# Patient Record
Sex: Female | Born: 1970
Health system: Southern US, Community
[De-identification: ages and names within clinical notes are randomized; demographics above are authoritative.]

## PROBLEM LIST (undated history)

## (undated) DIAGNOSIS — T7840XA Allergy, unspecified, initial encounter: Secondary | ICD-10-CM

## (undated) DIAGNOSIS — E785 Hyperlipidemia, unspecified: Secondary | ICD-10-CM

## (undated) DIAGNOSIS — E669 Obesity, unspecified: Secondary | ICD-10-CM

## (undated) HISTORY — DX: Hyperlipidemia, unspecified: E78.5

## (undated) HISTORY — DX: Allergy, unspecified, initial encounter: T78.40XA

## (undated) HISTORY — DX: Obesity, unspecified: E66.9

---

## 2003-08-23 ENCOUNTER — Emergency Department (HOSPITAL_COMMUNITY): Admission: EM | Admit: 2003-08-23 | Discharge: 2003-08-24 | Payer: Self-pay | Admitting: *Deleted

## 2003-08-25 ENCOUNTER — Emergency Department (HOSPITAL_COMMUNITY): Admission: EM | Admit: 2003-08-25 | Discharge: 2003-08-25 | Payer: Self-pay | Admitting: Emergency Medicine

## 2004-04-08 ENCOUNTER — Observation Stay: Payer: Self-pay

## 2004-06-07 ENCOUNTER — Observation Stay: Payer: Self-pay | Admitting: Obstetrics & Gynecology

## 2004-06-08 ENCOUNTER — Inpatient Hospital Stay: Payer: Self-pay | Admitting: Obstetrics & Gynecology

## 2004-08-13 ENCOUNTER — Emergency Department: Payer: Self-pay | Admitting: Emergency Medicine

## 2004-08-14 ENCOUNTER — Ambulatory Visit: Payer: Self-pay | Admitting: Emergency Medicine

## 2005-05-03 ENCOUNTER — Ambulatory Visit: Payer: Self-pay | Admitting: Obstetrics and Gynecology

## 2005-07-29 IMAGING — CR DG CHEST 1V PORT
1 series · 1 of 1 positions shown · non-contrast
Comparison: none

REASON FOR EXAM: sob/difficulty of breathing
COMMENTS:

PROCEDURE:     DXR - DXR PORTABLE CHEST SINGLE VIEW  - June 08, 2004  [DATE]
RESULT:     Portable chest shows the lungs to be clear and well expanded
without evidence of infiltrates, effusions, or mass.

[view not recorded]
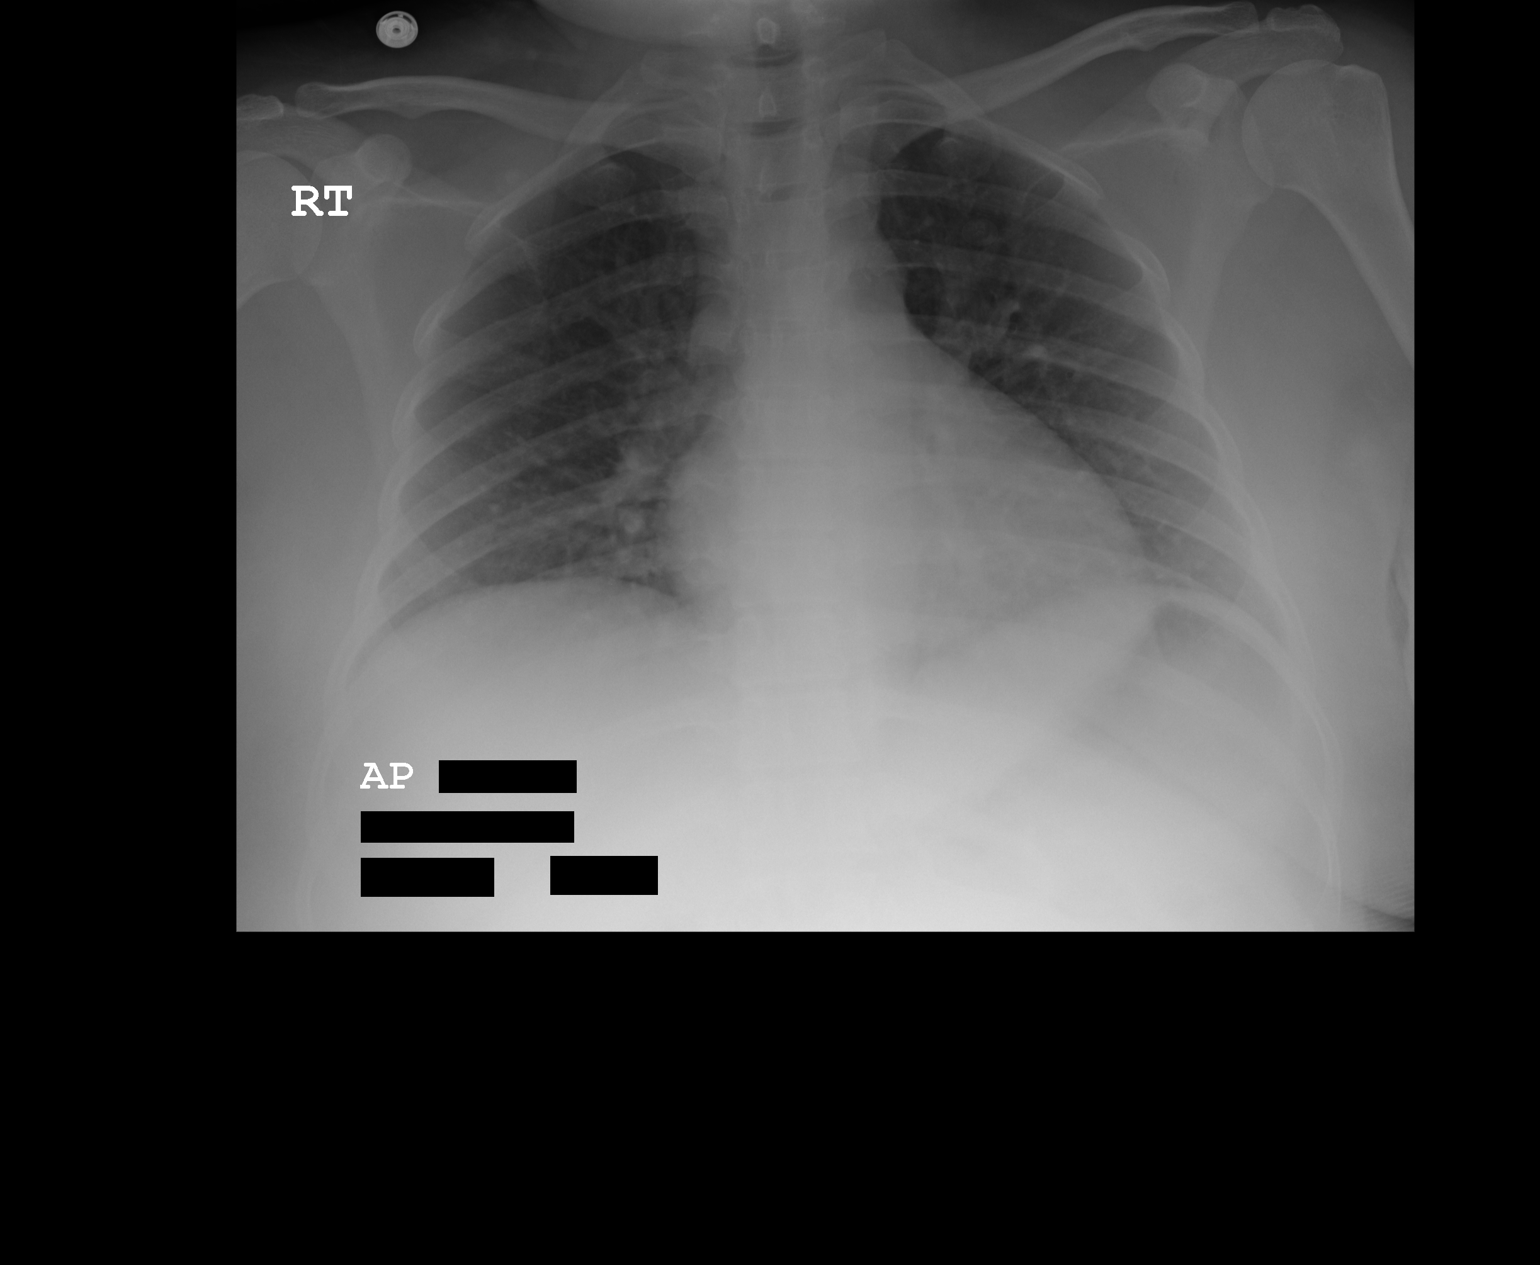

[1 of 1 positions shown; findings below may reference images not displayed]

IMPRESSION: 1)No acute pulmonary disease.

## 2009-06-17 ENCOUNTER — Emergency Department (HOSPITAL_COMMUNITY): Admission: EM | Admit: 2009-06-17 | Discharge: 2009-06-17 | Payer: Self-pay | Admitting: Emergency Medicine

## 2009-12-19 ENCOUNTER — Emergency Department (HOSPITAL_COMMUNITY): Admission: EM | Admit: 2009-12-19 | Discharge: 2009-12-20 | Payer: Self-pay | Admitting: Emergency Medicine

## 2009-12-27 ENCOUNTER — Emergency Department (HOSPITAL_COMMUNITY)
Admission: EM | Admit: 2009-12-27 | Discharge: 2009-12-27 | Payer: Self-pay | Source: Home / Self Care | Admitting: Emergency Medicine

## 2010-02-02 ENCOUNTER — Ambulatory Visit (HOSPITAL_COMMUNITY): Admission: RE | Admit: 2010-02-02 | Payer: Self-pay | Source: Home / Self Care | Admitting: Family Medicine

## 2010-03-11 ENCOUNTER — Other Ambulatory Visit (HOSPITAL_COMMUNITY): Payer: Self-pay | Admitting: *Deleted

## 2010-03-11 DIAGNOSIS — Z139 Encounter for screening, unspecified: Secondary | ICD-10-CM

## 2010-03-17 ENCOUNTER — Ambulatory Visit (HOSPITAL_COMMUNITY): Payer: Self-pay

## 2010-03-17 ENCOUNTER — Ambulatory Visit (HOSPITAL_COMMUNITY)
Admission: RE | Admit: 2010-03-17 | Discharge: 2010-03-17 | Disposition: A | Discharge status: Home / Self Care | Payer: PRIVATE HEALTH INSURANCE | Source: Ambulatory Visit | Attending: Family Medicine | Admitting: Family Medicine

## 2010-03-17 DIAGNOSIS — Z1231 Encounter for screening mammogram for malignant neoplasm of breast: Secondary | ICD-10-CM | POA: Insufficient documentation

## 2010-03-17 DIAGNOSIS — Z139 Encounter for screening, unspecified: Secondary | ICD-10-CM

## 2010-04-06 LAB — CBC
Hemoglobin: 14.7 g/dL (ref 12.0–15.0)
MCH: 29.2 pg (ref 26.0–34.0)
MCV: 81.9 fL (ref 78.0–100.0)
Platelets: 327 10*3/uL (ref 150–400)
RBC: 5.03 MIL/uL (ref 3.87–5.11)
WBC: 9.5 10*3/uL (ref 4.0–10.5)

## 2010-04-06 LAB — URINALYSIS, ROUTINE W REFLEX MICROSCOPIC
Bilirubin Urine: NEGATIVE
Glucose, UA: NEGATIVE mg/dL
Ketones, ur: NEGATIVE mg/dL
Nitrite: NEGATIVE
Specific Gravity, Urine: <1.005 — ABNORMAL LOW (ref 1.005–1.030)
pH: 5 (ref 5.0–8.0)

## 2010-04-06 LAB — LIPASE, BLOOD: Lipase: 28 U/L (ref 11–59)

## 2010-04-06 LAB — COMPREHENSIVE METABOLIC PANEL
ALT: 29 U/L (ref 0–35)
AST: 24 U/L (ref 0–37)
Albumin: 4.5 g/dL (ref 3.5–5.2)
Alkaline Phosphatase: 77 U/L (ref 39–117)
CO2: 28 mEq/L (ref 19–32)
Chloride: 101 mEq/L (ref 96–112)
Creatinine, Ser: 0.74 mg/dL (ref 0.4–1.2)
GFR calc Af Amer: >60 mL/min (ref >60)
GFR calc non Af Amer: >60 mL/min (ref >60)
Potassium: 3.9 mEq/L (ref 3.5–5.1)
Total Bilirubin: 0.3 mg/dL (ref 0.3–1.2)

## 2010-04-06 LAB — DIFFERENTIAL
Basophils Absolute: 0 10*3/uL (ref 0.0–0.1)
Basophils Relative: 0 % (ref 0–1)
Eosinophils Absolute: 0.1 10*3/uL (ref 0.0–0.7)
Eosinophils Relative: 1 % (ref 0–5)
Monocytes Absolute: 0.5 10*3/uL (ref 0.1–1.0)

## 2010-04-07 LAB — URINALYSIS, ROUTINE W REFLEX MICROSCOPIC
Bilirubin Urine: NEGATIVE
Ketones, ur: NEGATIVE mg/dL
Nitrite: NEGATIVE
Protein, ur: NEGATIVE mg/dL
Specific Gravity, Urine: 1.02 (ref 1.005–1.030)
Urobilinogen, UA: 0.2 mg/dL (ref 0.0–1.0)

## 2010-04-07 LAB — URINE MICROSCOPIC-ADD ON

## 2010-11-13 ENCOUNTER — Emergency Department (HOSPITAL_COMMUNITY)
Admission: EM | Admit: 2010-11-13 | Discharge: 2010-11-14 | Disposition: A | Discharge status: Home / Self Care | Payer: Self-pay | Attending: Emergency Medicine | Admitting: Emergency Medicine

## 2010-11-13 ENCOUNTER — Emergency Department (HOSPITAL_COMMUNITY): Payer: Self-pay

## 2010-11-13 DIAGNOSIS — N898 Other specified noninflammatory disorders of vagina: Secondary | ICD-10-CM | POA: Insufficient documentation

## 2010-11-13 DIAGNOSIS — N949 Unspecified condition associated with female genital organs and menstrual cycle: Secondary | ICD-10-CM | POA: Insufficient documentation

## 2010-11-13 LAB — URINALYSIS, ROUTINE W REFLEX MICROSCOPIC
Ketones, ur: NEGATIVE mg/dL
Nitrite: NEGATIVE
pH: 8 (ref 5.0–8.0)

## 2010-11-13 LAB — POCT PREGNANCY, URINE: Preg Test, Ur: NEGATIVE

## 2010-11-13 LAB — URINE MICROSCOPIC-ADD ON

## 2010-11-13 LAB — WET PREP, GENITAL
Trich, Wet Prep: NONE SEEN
Yeast Wet Prep HPF POC: NONE SEEN

## 2010-11-15 LAB — URINE CULTURE
Colony Count: >=100000
Culture  Setup Time: 201210200321

## 2011-01-26 HISTORY — PX: ABDOMINAL HYSTERECTOMY: SHX81

## 2012-12-26 ENCOUNTER — Ambulatory Visit: Payer: PRIVATE HEALTH INSURANCE | Attending: Internal Medicine

## 2013-01-10 ENCOUNTER — Encounter (HOSPITAL_COMMUNITY): Payer: Self-pay | Admitting: Emergency Medicine

## 2013-01-10 ENCOUNTER — Emergency Department (HOSPITAL_COMMUNITY)
Admission: EM | Admit: 2013-01-10 | Discharge: 2013-01-10 | Disposition: A | Discharge status: Home / Self Care | Payer: No Typology Code available for payment source | Source: Home / Self Care

## 2013-01-10 DIAGNOSIS — J329 Chronic sinusitis, unspecified: Secondary | ICD-10-CM

## 2013-01-10 DIAGNOSIS — J069 Acute upper respiratory infection, unspecified: Secondary | ICD-10-CM

## 2013-01-10 DIAGNOSIS — R05 Cough: Secondary | ICD-10-CM

## 2013-01-10 DIAGNOSIS — R059 Cough, unspecified: Secondary | ICD-10-CM

## 2013-01-10 MED ORDER — PHENYLEPHRINE-CHLORPHEN-DM 10-4-12.5 MG/5ML PO LIQD: 5.0000 mL | ORAL | Status: DC | PRN

## 2013-01-10 MED ORDER — GUAIFENESIN-CODEINE 100-10 MG/5ML PO SYRP: 5.0000 mL | ORAL_SOLUTION | Freq: Four times a day (QID) | ORAL | Status: DC | PRN

## 2013-01-10 NOTE — ED Notes (Signed)
Cough, congestion x 1 week or so; NAD; sore  in upper back, throat, chest

## 2013-01-10 NOTE — ED Provider Notes (Signed)
CSN: [id number]     Arrival date & time 01/10/13  1236 History   First MD Initiated Contact with Patient 01/10/13 1457     Chief Complaint  Patient presents with  . Cough   (Consider location/radiation/quality/duration/timing/severity/associated sxs/prior Treatment) HPI Comments: 42 year old female complaining of cough, sore throat, nasal congestion, PND that is worse when supine for 5 days. She states the PND and cough is worse when supine. Denies earache or fever. She is taking Alka-Seltzer cold plus but without significant relief.   History reviewed. No pertinent past medical history. History reviewed. No pertinent past surgical history. History reviewed. No pertinent family history. History  Substance Use Topics  . Smoking status: Never Smoker   . Smokeless tobacco: Not on file  . Alcohol Use: No   OB History   Grav Para Term Preterm Abortions TAB SAB Ect Mult Living                 Review of Systems  Constitutional: Negative for fever, chills, activity change, appetite change and fatigue.  HENT: Positive for congestion, postnasal drip, rhinorrhea and sore throat. Negative for facial swelling.   Eyes: Negative.   Respiratory: Positive for cough. Negative for shortness of breath and wheezing.   Cardiovascular: Negative.   Gastrointestinal: Negative.   Genitourinary: Negative.   Musculoskeletal: Negative for neck pain and neck stiffness.  Skin: Negative for pallor and rash.  Neurological: Negative.     Allergies  Review of patient's allergies indicates no known allergies.  Home Medications   Current Outpatient Rx  Name  Route  Sig  Dispense  Refill  . guaiFENesin-codeine (CHERATUSSIN AC) 100-10 MG/5ML syrup   Oral   Take 5 mLs by mouth 4 (four) times daily as needed for cough or congestion.   120 mL   0   . Phenylephrine-Chlorphen-DM 10-29-10.5 MG/5ML LIQD   Oral   Take 5 mLs by mouth every 4 (four) hours as needed.   120 mL   0    BP 103/63  Pulse 88   Temp(Src) 98.3 F (36.8 C) (Oral)  Resp 16  SpO2 100% Physical Exam  Nursing note and vitals reviewed. Constitutional: She is oriented to person, place, and time. She appears well-developed and well-nourished. No distress.  HENT:  Mouth/Throat: No oropharyngeal exudate.  Bilateral TMs are normal Oropharynx with minor erythema and clear PND.  Eyes: EOM are normal.  Neck: Normal range of motion. Neck supple.  Cardiovascular: Normal rate, regular rhythm and normal heart sounds.   Pulmonary/Chest: Effort normal and breath sounds normal. No respiratory distress. She has no wheezes. She has no rales.  Musculoskeletal: Normal range of motion. She exhibits no edema.  Lymphadenopathy:    She has no cervical adenopathy.  Neurological: She is alert and oriented to person, place, and time.  Skin: Skin is warm and dry. No rash noted.  Psychiatric: She has a normal mood and affect.    ED Course  Procedures (including critical care time) Labs Review Labs Reviewed - No data to display Imaging Review No results found.    MDM   1. URI (upper respiratory infection)   2. Rhinosinusitis   3. Cough      Norell CS 1 teaspoon every 4 hours when necessary URI symptoms Cheratussin 1 teaspoon q. 4 hours when necessary cough and congestion Drink plenty of fluids stay well hydrated URI instructions  Followup with your PCP as needed.  Hayden Rasmussen, NP 01/10/13 [phone]

## 2013-01-11 NOTE — ED Provider Notes (Signed)
Medical screening examination/treatment/procedure(s) were performed by resident physician or non-physician practitioner and as supervising physician I was immediately available for consultation/collaboration.   Barkley Bruns MD.   Linna Hoff, MD 01/11/13 [phone]

## 2013-01-17 ENCOUNTER — Ambulatory Visit: Payer: No Typology Code available for payment source | Attending: Internal Medicine | Admitting: Internal Medicine

## 2013-01-17 ENCOUNTER — Encounter: Payer: Self-pay | Admitting: Internal Medicine

## 2013-01-17 VITALS — BP 104/71 | HR 109 | Temp 98.7°F | Resp 14 | Ht <= 58 in | Wt 157.2 lb

## 2013-01-17 DIAGNOSIS — R05 Cough: Secondary | ICD-10-CM

## 2013-01-17 DIAGNOSIS — R0981 Nasal congestion: Secondary | ICD-10-CM | POA: Insufficient documentation

## 2013-01-17 DIAGNOSIS — Z139 Encounter for screening, unspecified: Secondary | ICD-10-CM

## 2013-01-17 DIAGNOSIS — J3489 Other specified disorders of nose and nasal sinuses: Secondary | ICD-10-CM

## 2013-01-17 DIAGNOSIS — J069 Acute upper respiratory infection, unspecified: Secondary | ICD-10-CM | POA: Insufficient documentation

## 2013-01-17 DIAGNOSIS — Z9109 Other allergy status, other than to drugs and biological substances: Secondary | ICD-10-CM | POA: Insufficient documentation

## 2013-01-17 LAB — CBC WITH DIFFERENTIAL/PLATELET
Basophils Absolute: 0 10*3/uL (ref 0.0–0.1)
Lymphocytes Relative: 29 % (ref 12–46)
Neutro Abs: 4.4 10*3/uL (ref 1.7–7.7)
Platelets: 322 10*3/uL (ref 150–400)
RDW: 14.4 % (ref 11.5–15.5)
WBC: 7.1 10*3/uL (ref 4.0–10.5)

## 2013-01-17 LAB — LIPID PANEL
Cholesterol: 178 mg/dL (ref 0–200)
HDL: 45 mg/dL (ref >39)
Total CHOL/HDL Ratio: 4 Ratio
VLDL: 33 mg/dL (ref 0–40)

## 2013-01-17 LAB — COMPLETE METABOLIC PANEL WITH GFR
ALT: 27 U/L (ref 0–35)
AST: 21 U/L (ref 0–37)
Albumin: 4.1 g/dL (ref 3.5–5.2)
Alkaline Phosphatase: 95 U/L (ref 39–117)
Calcium: 9.4 mg/dL (ref 8.4–10.5)
Chloride: 104 mEq/L (ref 96–112)
Creat: 0.79 mg/dL (ref 0.50–1.10)
Potassium: 4.2 mEq/L (ref 3.5–5.3)

## 2013-01-17 MED ORDER — FLUTICASONE PROPIONATE 50 MCG/ACT NA SUSP: 2.0000 | Freq: Every day | NASAL | Status: DC

## 2013-01-17 MED ORDER — CETIRIZINE HCL 10 MG PO CAPS: 1.0000 | ORAL_CAPSULE | Freq: Every evening | ORAL | Status: DC | PRN

## 2013-01-17 MED ORDER — GUAIFENESIN-CODEINE 100-10 MG/5ML PO SYRP: 5.0000 mL | ORAL_SOLUTION | Freq: Four times a day (QID) | ORAL | Status: DC | PRN

## 2013-01-17 MED ORDER — AZITHROMYCIN 250 MG PO TABS: ORAL_TABLET | ORAL | Status: DC

## 2013-01-17 NOTE — Progress Notes (Signed)
Patient Demographics  Shelby Mccann, is a 42 y.o. female  RUE:[id number]  JYN:[id number]  DOB - 07-31-1970  CC:  Chief Complaint  Patient presents with  . Establish Care       HPI: Shelby Mccann is a 42 y.o. female here today to establish medical care. Patient reported to have ongoing URI symptoms of nasal congestion postnasal drip minimal productive cough headache for the last 4 weeks, she went to the urgent care recently and was prescribed cough medication she reports minimal improvement currently denies any fever chills denies any shortness of breath Patient has No headache, No chest pain, No abdominal pain - No Nausea, No new weakness tingling or numbness, patient also has some and her mental allergies she is requesting an allergy medication.  No Known Allergies History reviewed. No pertinent past medical history. Current Outpatient Prescriptions on File Prior to Visit  Medication Sig Dispense Refill  . Phenylephrine-Chlorphen-DM 10-29-10.5 MG/5ML LIQD Take 5 mLs by mouth every 4 (four) hours as needed.  120 mL  0   No current facility-administered medications on file prior to visit.   Family History  Problem Relation Age of Onset  . Cancer Mother     utetrine cancer   . Cancer Brother    History   Social History  . Marital Status: Single    Spouse Name: N/A    Number of Children: N/A  . Years of Education: N/A   Occupational History  . Not on file.   Social History Main Topics  . Smoking status: Never Smoker   . Smokeless tobacco: Not on file  . Alcohol Use: No  . Drug Use: Not on file  . Sexual Activity: Not on file   Other Topics Concern  . Not on file   Social History Narrative  . No narrative on file    Review of Systems: Constitutional: Negative for fever, chills, diaphoresis, activity change, appetite change and fatigue. HENT: Negative for ear pain, nosebleeds, +congestion, facial swelling, +rhinorrhea, neck pain, neck stiffness and ear discharge.  Eyes:  Negative for pain, discharge, redness, itching and visual disturbance. Respiratory: Positive for cough, negative for choking, chest tightness, shortness of breath, wheezing and stridor.  Cardiovascular: Negative for chest pain, palpitations and leg swelling. Gastrointestinal: Negative for abdominal distention. Genitourinary: Negative for dysuria, urgency, frequency, hematuria, flank pain, decreased urine volume, difficulty urinating and dyspareunia.  Musculoskeletal: Negative for back pain, joint swelling, arthralgia and gait problem. Neurological: Negative for dizziness, tremors, seizures, syncope, facial asymmetry, speech difficulty, weakness, light-headedness, numbness and headaches.  Hematological: Negative for adenopathy. Does not bruise/bleed easily. Psychiatric/Behavioral: Negative for hallucinations, behavioral problems, confusion, dysphoric mood, decreased concentration and agitation.    Objective:   Filed Vitals:   01/17/13 1301  BP: 104/71  Pulse: 109  Temp: 98.7 F (37.1 C)  Resp: 14    Physical Exam: Constitutional: Patient appears well-developed and well-nourished. No distress. HENT: Normocephalic, atraumatic, External right and left ear normal. Oropharynx is clear and moist. Minimal pharyngeal erythema. Nasal congestion no sinus tenderness  Eyes: Conjunctivae and EOM are normal. PERRLA, no scleral icterus. Neck: Normal ROM. Neck supple. No JVD. No tracheal deviation. No thyromegaly. CVS: RRR, S1/S2 +, no murmurs, no gallops, no carotid bruit.  Pulmonary: Effort and breath sounds normal, no stridor, rhonchi, wheezes, rales.  Abdominal: Soft. BS +, no distension, tenderness, rebound or guarding.  Musculoskeletal: Normal range of motion. No edema and no tenderness.  Skin: Skin is warm and dry. No rash noted. Not diaphoretic. No  erythema. No pallor. Psychiatric: Normal mood and affect. Behavior, judgment, thought content normal.  Lab Results  Component Value Date   WBC  9.5 12/27/2009   HGB 14.7 12/27/2009   HCT 41.2 12/27/2009   MCV 81.9 12/27/2009   PLT 327 12/27/2009   Lab Results  Component Value Date   CREATININE 0.74 12/27/2009   BUN 11 12/27/2009   NA 136 12/27/2009   K 3.9 12/27/2009   CL 101 12/27/2009   CO2 28 12/27/2009    No results found for this basename: HGBA1C   Lipid Panel  No results found for this basename: chol, trig, hdl, cholhdl, vldl, ldlcalc       Assessment and plan:   1. Screening We'll do baseline blood work - CBC with Differential - COMPLETE METABOLIC PANEL WITH GFR - TSH - Lipid panel - Vit D  25 hydroxy (rtn osteoporosis monitoring)  2. URI (upper respiratory infection) I prescribed - azithromycin (ZITHROMAX Z-PAK) 250 MG tablet; Take as directed  Dispense: 6 each; Refill: 0  3. Nasal congestion  - fluticasone (FLONASE) 50 MCG/ACT nasal spray; Place 2 sprays into both nostrils daily.  Dispense: 16 g; Refill: 3  4. Environmental allergies  - Cetirizine HCl (ZYRTEC ALLERGY) 10 MG CAPS; Take 1 capsule (10 mg total) by mouth at bedtime and may repeat dose one time if needed.  Dispense: 30 capsule; Refill: 0  5. Cough Medication refill done - guaiFENesin-codeine (CHERATUSSIN AC) 100-10 MG/5ML syrup; Take 5 mLs by mouth 4 (four) times daily as needed for cough or congestion.  Dispense: 120 mL; Refill: 0     Follow up in 6 weeks   Anderia Lorenzo, Ayesha Rumpf, MD

## 2013-01-17 NOTE — Progress Notes (Signed)
Pt is here to establish care. Complains of a cough x3 weeks. Experiencing pain in forehead, congestion and nasal discharge. Has taken medication, but is not working. Pt is using the interpreter line.

## 2013-01-22 ENCOUNTER — Telehealth: Payer: Self-pay | Admitting: Emergency Medicine

## 2013-01-22 NOTE — Telephone Encounter (Signed)
Pt given lab results with instructions per pacific interpretor for Spanish Pt encouraged to follow low fat diet and exercise and will recheck at next appt

## 2013-01-22 NOTE — Progress Notes (Signed)
Pt given results with instructions

## 2013-01-22 NOTE — Telephone Encounter (Signed)
Message copied by Darlis Loan on Mon Jan 22, 2013 10:41 AM ------      Message from: Doris Cheadle      Created: Mon Jan 22, 2013 10:37 AM       Blood work reviewed, noticed elevated triglycerides, advise patient for low fat diet.       ------

## 2013-02-28 ENCOUNTER — Encounter: Payer: Self-pay | Admitting: Internal Medicine

## 2013-02-28 ENCOUNTER — Ambulatory Visit: Payer: No Typology Code available for payment source | Attending: Internal Medicine | Admitting: Internal Medicine

## 2013-02-28 ENCOUNTER — Ambulatory Visit (HOSPITAL_COMMUNITY)
Admission: RE | Admit: 2013-02-28 | Discharge: 2013-02-28 | Disposition: A | Discharge status: Home / Self Care | Payer: No Typology Code available for payment source | Source: Ambulatory Visit | Attending: Internal Medicine | Admitting: Internal Medicine

## 2013-02-28 VITALS — BP 119/72 | HR 87 | Temp 98.3°F | Resp 18 | Ht 58.5 in | Wt 155.4 lb

## 2013-02-28 DIAGNOSIS — M25539 Pain in unspecified wrist: Secondary | ICD-10-CM | POA: Insufficient documentation

## 2013-02-28 DIAGNOSIS — M25532 Pain in left wrist: Secondary | ICD-10-CM

## 2013-02-28 DIAGNOSIS — E782 Mixed hyperlipidemia: Secondary | ICD-10-CM | POA: Insufficient documentation

## 2013-02-28 DIAGNOSIS — Z09 Encounter for follow-up examination after completed treatment for conditions other than malignant neoplasm: Secondary | ICD-10-CM

## 2013-02-28 DIAGNOSIS — L293 Anogenital pruritus, unspecified: Secondary | ICD-10-CM

## 2013-02-28 DIAGNOSIS — Z139 Encounter for screening, unspecified: Secondary | ICD-10-CM

## 2013-02-28 DIAGNOSIS — N898 Other specified noninflammatory disorders of vagina: Secondary | ICD-10-CM

## 2013-02-28 DIAGNOSIS — E785 Hyperlipidemia, unspecified: Secondary | ICD-10-CM

## 2013-02-28 MED ORDER — IBUPROFEN 600 MG PO TABS: 600.0000 mg | ORAL_TABLET | Freq: Three times a day (TID) | ORAL | Status: DC | PRN

## 2013-02-28 MED ORDER — FLUCONAZOLE 150 MG PO TABS: 150.0000 mg | ORAL_TABLET | Freq: Once | ORAL | Status: DC

## 2013-02-28 NOTE — Progress Notes (Signed)
MRN: [id number] Name: Shelby Mccann  Sex: female Age: 43 y.o. DOB: Oct 30, 1970  Allergies: Review of patient's allergies indicates no known allergies.  Chief Complaint  Patient presents with  . Follow-up    HPI: Patient is 43 y.o. female who comes today for followup ,, on the last visit she had URI symptoms which are improved ,  had a blood work done which was reviewed with the patient.Is slightly elevated cholesterol, she's advised for low-fat diet, she also reported to have some vaginal itching, tried over-the-counter medication without much improvement, has not been seen by gynecologist, she also reported to have left wrist pain and has some ? Bony growth/lump denies any weakness or numbness.   History reviewed. No pertinent past medical history.  Past Surgical History  Procedure Laterality Date  . Abdominal hysterectomy        Medication List       This list is accurate as of: 02/28/13 11:39 AM.  Always use your most recent med list.               fluconazole 150 MG tablet  Commonly known as:  DIFLUCAN  Take 1 tablet (150 mg total) by mouth once.        Meds ordered this encounter  Medications  . fluconazole (DIFLUCAN) 150 MG tablet    Sig: Take 1 tablet (150 mg total) by mouth once.    Dispense:  1 tablet    Refill:  0     There is no immunization history on file for this patient.  Family History  Problem Relation Age of Onset  . Cancer Mother     utetrine cancer   . Cancer Brother     History  Substance Use Topics  . Smoking status: Never Smoker   . Smokeless tobacco: Not on file  . Alcohol Use: No    Review of Systems  As noted in HPI  Filed Vitals:   02/28/13 1116  BP: 119/72  Pulse: 87  Temp: 98.3 F (36.8 C)  Resp: 18    Physical Exam  Physical Exam  Constitutional: No distress.  Cardiovascular: Normal rate and regular rhythm.   Pulmonary/Chest: Breath sounds normal. No respiratory distress. She has no wheezes. She has no rales.   Musculoskeletal:  Left wrist dorsal aspect ? Bony growth , minimal tenderness , Good ROM, 2+ radial pulse     CBC    Component Value Date/Time   WBC 7.1 01/17/2013 1340   RBC 4.68 01/17/2013 1340   HGB 13.4 01/17/2013 1340   HCT 38.6 01/17/2013 1340   PLT 322 01/17/2013 1340   MCV 82.5 01/17/2013 1340   LYMPHSABS 2.1 01/17/2013 1340   MONOABS 0.5 01/17/2013 1340   EOSABS 0.1 01/17/2013 1340   BASOSABS 0.0 01/17/2013 1340    CMP     Component Value Date/Time   NA 140 01/17/2013 1340   K 4.2 01/17/2013 1340   CL 104 01/17/2013 1340   CO2 28 01/17/2013 1340   GLUCOSE 90 01/17/2013 1340   BUN 11 01/17/2013 1340   CREATININE 0.79 01/17/2013 1340   CREATININE 0.74 12/27/2009 1512   CALCIUM 9.4 01/17/2013 1340   PROT 7.1 01/17/2013 1340   ALBUMIN 4.1 01/17/2013 1340   AST 21 01/17/2013 1340   ALT 27 01/17/2013 1340   ALKPHOS 95 01/17/2013 1340   BILITOT 0.2* 01/17/2013 1340   GFRNONAA >60 12/27/2009 1512   GFRAA  Value: >60        The  eGFR has been calculated using the MDRD equation. This calculation has not been validated in all clinical situations. eGFR's persistently <60 mL/min signify possible Chronic Kidney Disease. 12/27/2009 1512    Lab Results  Component Value Date/Time   CHOL 178 01/17/2013  1:40 PM    No components found with this basename: hga1c    Lab Results  Component Value Date/Time   AST 21 01/17/2013  1:40 PM    Assessment and Plan  Other and unspecified hyperlipidemia Low-fat diet   Screening - Plan: MM DIGITAL SCREENING BILATERAL, Ambulatory referral to Gynecology  Vaginal itching - Plan: Ambulatory referral to Gynecology, fluconazole (DIFLUCAN) 150 MG tablet  Left wrist pain - Plan: DG Wrist Complete Left, ibuprofen when necessary for pain     Return in about 3 months (around 05/28/2013), or if symptoms worsen or fail to improve.  Lorayne Marek, MD

## 2013-02-28 NOTE — Patient Instructions (Signed)
Dieta para el control del colesterol y las grasas   (Fat and Cholesterol Control Diet)  Los niveles de grasa y colesterol en la sangre y en los órganos se ven influidos por la dieta. Los niveles altos de grasa y colesterol pueden conducir a enfermedades del corazón, de los pequeños y los grandes vasos sanguíneos, de la vesícula biliar, el hígado y el páncreas.   CONTROL DE LA GRASA Y EL COLESTEROL CON LA DIETA   Aunque el ejercicio y el estilo de vida son factores importantes, su dieta es la clave. Esto se debe a que se sabe que ciertos alimentos hacen subir el colesterol y otros lo bajan. El objetivo debe ser equilibrar los alimentos, de modo que tengan un efecto sobre el colesterol y, aún más importante, reemplazar las grasas saturadas y trans con otros tipos de grasas, como las monoinsaturadas y las poliinsaturadas y ácidos grasos omega-3.   En promedio, una persona no debe consumir más de 15 a 17 g de grasas saturadas por día. Las grasas saturadas y trans se consideran grasas "malas", ya que elevan el colesterol LDL. Las grasas saturadas se encuentran principalmente en productos animales como carne, manteca y crema. Sin embargo, eso no significa que tenga que renunciar a todas sus comidas favoritas. Actualmente, hay buenos sustitutos bajos en colesterol, bajos en grasas para la mayoría de las cosas que le gusta comer. Elija aquellos alimentos alternativos que sean bajos en grasas o sin grasas. Elija cortes de peceto o lomo de carne roja. Estos tipos de cortes contienen menos grasa y colesterol. Pollo (sin la piel), pescado, ternera y pechuga de pavo molida son excelentes opciones. Eliminar las carnes grasas, como las salchichas y el salame. Los mariscos contienen poca o casi nada de grasas saturadas. Consuma una porción de 3 oz (85 g) de carne magra, aves o pescado.   Las grasas trans también se llaman "aceites parcialmente hidrogenados". Son aceites manipulados científicamente de modo que son sólidos a  temperatura ambiente, tienen una larga vida y mejoran el sabor y la textura de los alimentos a los que se agregan. Las grasas trans se encuentran en la margarina, masitas, crackers y alimentos horneados.   Al hornear y cocinar, el aceite es un buen sustituto de la manteca. Los aceites monoinsaturados son beneficiosos, sobre todo porque se cree que reducen el colesterol LDL y aumentan el HDL. Los aceites que hay que evitar completamente son los aceites tropicales saturados, como el de coco y palma.   Recuerde consumir una gran cantidad de alimentos de los grupos que están naturalmente libres de grasas saturadas y grasas trans, e incluya pescado, frutas, verduras, frijoles, granos (cebada, arroz, cuscús, trigo bulgur) y pastas (sin salsas de crema).   IDENTIFICACIÓN DE LOS ALIMENTOS QUE REDUCEN LAS GRASAS Y ELCOLESTEROL   La fibra soluble puede reducir el colesterol. Este tipo de fibra se encuentra en las frutas como manzanas, verduras como el brócoli, papas y zanahorias, las legumbres como los frijoles, guisantes y lentejas y granos como la cebada. Los alimentos enriquecidos con esteroles vegetales (fitosteroles) también pueden reducir el colesterol. Consuma al menos 2 g por día de estos alimentos para un efecto de disminución del colesterol.   Lea las etiquetas de los paquetes para identificar los alimentos bajos en grasas saturadas, en grasas trans y los bajos en grasas en el supermercado. Seleccione los quesos que tienen sólo 2 a 3 g de grasa saturada por onza. Utilice margarina saludable para el corazón que sea libre de grasas trans o   aceites parcialmente hidrogenados. Al comprar productos de panadería (galletas, crackers), se deben evitar los aceites parcialmente hidrogenados. Panes y panecillos deben hacerse con cereales integrales (trigo integral o harina de avena integral en lugar de " harina " o " harina enriquecida ") Compre sopas en lata que no sean cremosas, con bajo contenido de sal y sin grasas  adicionadas.   TÉCNICAS DE PREPARACIÓN DE LOS ALIMENTOS   Nunca prepare los alimentos fritos. Si usted debe freír, saltee los alimentos en muy poca grasa o use un aerosol de cocina anti adherente. Siempre que sea posible, debe hervir, hornear o asar las carnes y preparar las verduras al vapor. En lugar de agregar mantequilla o margarina a las verduras, use limón y hierbas, puré de manzana y canela (para la calabaza y la batata). Utilice yogur natural sin grasa, salsas y aderezos bajos en grasa para ensaladas.   BAJO EN GRASAS SATURADAS / SUSTITUTOS BAJOS EN GRASA   Carnes / grasas saturadas (g)  · Evite: Bife, veteado (3 oz/85 g) / 11 g  · Elija: Bife, magro(3 oz/85 g) / 4 g  · Evite: Hamburguesa (3 oz/85 g) / 7 g  · Elija: Hamburguesa, magra (3 oz/85 g) / 5 g  · Evite: Jamón (3 oz/85 g) / 6 g  · Elija: Jamón, corte magro (3 oz/85 g) / 2,4 g  · Evite: Pollo con piel, carne oscura (3 oz/85 g) / 4 g  · Elija: Pollo, sin piel, carne oscura (3 oz/85 g) / 2 g  · Evite: Pollo con piel, carne blanca (3 oz/85 g) / 2,5 g  · Elija: Pollo, sin piel, carne blanca (3 oz/85 g) / 1 g  Lácteos / Grasa saturada (g)   · Evite: Leche entera (1 taza) / 5 g  · Elija: Leche descremada, 2% (1 taza) / 3 g  · Elija: Leche descremada, 1% (1 taza) / 1,5 g  · Elija: Leche descremada, 1 taza (0,3 g).  · Evite: Queso duro (1 oz/28 g) / 6 g  · Elija: Queso de leche descremada (1 oz/28 g) / 2 a 3 g  · Evite: Queso cottage, 4% de grasa (1 taza) / 6,5 g  · Elija: Queso cottage bajo en grasa, 1% de grasa (1 taza) / 1,5 g  · Evite: Helado (1 taza) / 9 g  · Elija: Sorbete (1 taza) / 2,5 g  · Elija: Yogur congelado descremado (1 taza) / 0,3 g  · Elija: Barra de frutas congeladas / trace  · Evite: Crema batida (1 cucharada) / 3,5 g  · Elija: Crema batida no láctea (1 cucharada) / 1 g  Condimentos / Grasas Saturadas (g)   · Evite: Mayonesa (1 cucharada) / 2 g  · Elija: Mayonesa baja en grasa (1 cucharada) / 1 g  · Evite: Mantequilla (1 cucharada) / 7  g  · Elija: Margarina light extra (1 cucharada) / 1 g  · Evite: Aceite de coco (1 cucharada) / 11,8 g  · Elija: Aceite de oliva (1 cucharada) / 1,8 g  · Elija: Aceite de maíz (1 cucharada) / 1,7 g  · Elija: Aceite de cártamo (1 cucharada) / 1,2 g  · Elija: Aceite de girasol (1 cucharada) / 1,4 g  · Elija: Aceite de soja (1 cucharada) / 0 mg / 2,4 g  · Elija: Aceite de canola (1 cucharada) / 0 mg / 1 g  Document Released: 01/11/2005 Document Revised: 05/08/2012  [employer]® Patient Information ©2014 [employer].

## 2013-04-18 ENCOUNTER — Encounter: Payer: No Typology Code available for payment source | Admitting: Family Medicine

## 2013-05-30 ENCOUNTER — Encounter: Payer: Self-pay | Admitting: Internal Medicine

## 2013-05-30 ENCOUNTER — Ambulatory Visit: Payer: No Typology Code available for payment source | Attending: Internal Medicine | Admitting: Internal Medicine

## 2013-05-30 VITALS — BP 126/78 | HR 70 | Temp 98.5°F | Resp 16

## 2013-05-30 DIAGNOSIS — J3489 Other specified disorders of nose and nasal sinuses: Secondary | ICD-10-CM

## 2013-05-30 DIAGNOSIS — Z79899 Other long term (current) drug therapy: Secondary | ICD-10-CM | POA: Insufficient documentation

## 2013-05-30 DIAGNOSIS — R202 Paresthesia of skin: Secondary | ICD-10-CM

## 2013-05-30 DIAGNOSIS — Z09 Encounter for follow-up examination after completed treatment for conditions other than malignant neoplasm: Secondary | ICD-10-CM

## 2013-05-30 DIAGNOSIS — R200 Anesthesia of skin: Secondary | ICD-10-CM

## 2013-05-30 DIAGNOSIS — R0981 Nasal congestion: Secondary | ICD-10-CM

## 2013-05-30 DIAGNOSIS — Z9109 Other allergy status, other than to drugs and biological substances: Secondary | ICD-10-CM

## 2013-05-30 DIAGNOSIS — J309 Allergic rhinitis, unspecified: Secondary | ICD-10-CM | POA: Insufficient documentation

## 2013-05-30 DIAGNOSIS — R209 Unspecified disturbances of skin sensation: Secondary | ICD-10-CM

## 2013-05-30 DIAGNOSIS — Z139 Encounter for screening, unspecified: Secondary | ICD-10-CM

## 2013-05-30 DIAGNOSIS — E785 Hyperlipidemia, unspecified: Secondary | ICD-10-CM

## 2013-05-30 MED ORDER — CETIRIZINE HCL 10 MG PO TABS: 10.0000 mg | ORAL_TABLET | Freq: Every day | ORAL | Status: DC

## 2013-05-30 MED ORDER — FLUTICASONE PROPIONATE 50 MCG/ACT NA SUSP: 2.0000 | Freq: Every day | NASAL | Status: DC

## 2013-05-30 NOTE — Progress Notes (Signed)
MRN: [id number] Name: Shelby Mccann  Sex: female Age: 43 y.o. DOB: August 26, 1970  Allergies: Review of patient's allergies indicates no known allergies.  Chief Complaint  Patient presents with  . Follow-up    HPI: Patient is 43 y.o. female who has history of hyperlipidemia, and order allergies comes today for followup, reported to have worsening allergy symptoms, she takes over-the-counter Allegra which does not help her with the symptoms, denies any fever chills chest and shortness of breath has lot of nasal congestion, patient also reported to have occasional numbness tingling in her right and denies any fall trauma.  History reviewed. No pertinent past medical history.  Past Surgical History  Procedure Laterality Date  . Abdominal hysterectomy        Medication List       This list is accurate as of: 05/30/13  2:36 PM.  Always use your most recent med list.               cetirizine 10 MG tablet  Commonly known as:  ZYRTEC  Take 1 tablet (10 mg total) by mouth daily.     fluconazole 150 MG tablet  Commonly known as:  DIFLUCAN  Take 1 tablet (150 mg total) by mouth once.     fluticasone 50 MCG/ACT nasal spray  Commonly known as:  FLONASE  Place 2 sprays into both nostrils daily.     ibuprofen 600 MG tablet  Commonly known as:  ADVIL,MOTRIN  Take 1 tablet (600 mg total) by mouth every 8 (eight) hours as needed.        Meds ordered this encounter  Medications  . cetirizine (ZYRTEC) 10 MG tablet    Sig: Take 1 tablet (10 mg total) by mouth daily.    Dispense:  30 tablet    Refill:  3  . fluticasone (FLONASE) 50 MCG/ACT nasal spray    Sig: Place 2 sprays into both nostrils daily.    Dispense:  16 g    Refill:  6     There is no immunization history on file for this patient.  Family History  Problem Relation Age of Onset  . Cancer Mother     utetrine cancer   . Cancer Brother     History  Substance Use Topics  . Smoking status: Never Smoker   .  Smokeless tobacco: Not on file  . Alcohol Use: No    Review of Systems   As noted in HPI  Filed Vitals:   05/30/13 1407  BP: 126/78  Pulse: 70  Temp: 98.5 F (36.9 C)  Resp: 16    Physical Exam  Physical Exam  Constitutional: No distress.  Eyes: EOM are normal. Pupils are equal, round, and reactive to light.  Cardiovascular: Normal rate and regular rhythm.   Pulmonary/Chest: Breath sounds normal. No respiratory distress. She has no wheezes. She has no rales.  Musculoskeletal: She exhibits no edema.    CBC    Component Value Date/Time   WBC 7.1 01/17/2013 1340   RBC 4.68 01/17/2013 1340   HGB 13.4 01/17/2013 1340   HCT 38.6 01/17/2013 1340   PLT 322 01/17/2013 1340   MCV 82.5 01/17/2013 1340   LYMPHSABS 2.1 01/17/2013 1340   MONOABS 0.5 01/17/2013 1340   EOSABS 0.1 01/17/2013 1340   BASOSABS 0.0 01/17/2013 1340    CMP     Component Value Date/Time   NA 140 01/17/2013 1340   K 4.2 01/17/2013 1340   CL 104 01/17/2013  1340   CO2 28 01/17/2013 1340   GLUCOSE 90 01/17/2013 1340   BUN 11 01/17/2013 1340   CREATININE 0.79 01/17/2013 1340   CREATININE 0.74 12/27/2009 1512   CALCIUM 9.4 01/17/2013 1340   PROT 7.1 01/17/2013 1340   ALBUMIN 4.1 01/17/2013 1340   AST 21 01/17/2013 1340   ALT 27 01/17/2013 1340   ALKPHOS 95 01/17/2013 1340   BILITOT 0.2* 01/17/2013 1340   GFRNONAA >89 01/17/2013 1340   GFRNONAA >60 12/27/2009 1512   GFRAA >89 01/17/2013 1340   GFRAA  Value: >60        The eGFR has been calculated using the MDRD equation. This calculation has not been validated in all clinical situations. eGFR's persistently <60 mL/min signify possible Chronic Kidney Disease. 12/27/2009 1512    Lab Results  Component Value Date/Time   CHOL 178 01/17/2013  1:40 PM    No components found with this basename: hga1c    Lab Results  Component Value Date/Time   AST 21 01/17/2013  1:40 PM    Assessment and Plan  Follow up  Other and unspecified hyperlipidemia  - Plan: Repeat fasting Lipid panel, continue with low-fat diet.  Environmental allergies - Plan: Patient will cetirizine (ZYRTEC) 10 MG tablet  Numbness and tingling in hands - Plan: Negative  Tinel's sign Will check Vitamin B12 level   Nasal congestion - Plan: fluticasone (FLONASE) 50 MCG/ACT nasal spray  Screening - Plan: MM DIGITAL SCREENING BILATERAL, Ambulatory referral to Gynecology   Health Maintenance  -Pap Smear: Referred to GYN -Mammogram: Ordered   Return in about 6 months (around 11/30/2013), or if symptoms worsen or fail to improve, for hyperipidemia.  Lorayne Marek, MD

## 2013-05-30 NOTE — Progress Notes (Signed)
Interpreter line used Patient here for her allergies- very bad right now Complains of having pain to both hands

## 2013-06-14 ENCOUNTER — Ambulatory Visit (INDEPENDENT_AMBULATORY_CARE_PROVIDER_SITE_OTHER): Payer: No Typology Code available for payment source | Admitting: Family Medicine

## 2013-06-14 ENCOUNTER — Emergency Department (HOSPITAL_COMMUNITY)
Admission: EM | Admit: 2013-06-14 | Discharge: 2013-06-14 | Disposition: A | Discharge status: Home / Self Care | Payer: Self-pay

## 2013-06-14 VITALS — BP 115/59 | HR 82 | Temp 99.1°F | Wt 157.9 lb

## 2013-06-14 DIAGNOSIS — Z01419 Encounter for gynecological examination (general) (routine) without abnormal findings: Secondary | ICD-10-CM

## 2013-06-14 NOTE — Patient Instructions (Addendum)
Fue un Oceanographer, hice el examen plvico y no tiene un cuello uterino por lo tanto no necesitas conseguir PAP hecho. Sin embargo recomiendo examen ginecolgico anual sin PAP.   Prueba de Papanicolaou  (Pap Test)  La prueba de Papanicolaou estudia las clulas en la superficie del cuello del tero. Su mdico buscar cambios celulares anormales, una infeccin o clulas cancerosas. Se llama displasia cuando las clulas no son normales. La displasia puede convertirse en cncer. Son importantes las pruebas regulares de Papanicolaou para detener el desarrollo del cncer. ANTES DEL PROCEDIMIENTO   Pregntele a su mdico cundo programar su prueba de Papanicolaou. Puede ser importante que programe la prueba lejos del momento del perodo.  No use duchas vaginales ni tenga sexo Electronics engineer) durante las 24 horas anteriores al procedimiento.  No use cremas vaginales ni tampones durante las 24 horas antes de la prueba.  Debe hacer pis orinar justo antes de la prueba. PROCEDIMIENTO   Deber Mady Haagensen en una camilla con los pies en los estribos.  Insertarn en la vagina un instrumento tibio metlico o de plstico (espculo) para Art therapist.  El mdico usar un pequeo cepillo de plstico o esptula de [place] para tomar clulas del cuello uterino.  Las clulas se colocan en un recipiente de laboratorio.  Luego se examinan en el microscopio para determinar si son normales o no. DESPUS DEL PROCEDIMIENTO  Retire los [employer] prueba. Si son anormales, puede ser que Chartered loss adjuster ms [employer].  Document Released: 04/28/2010 Document Revised: 04/05/2011 [facility] Patient Information 2014 [place], [place].

## 2013-06-14 NOTE — Progress Notes (Signed)
Patient ID: Shelby Mccann, female   DOB: April 05, 1970, 43 y.o.   MRN: [id number] Subjective:     Shelby Mccann is a 43 y.o. woman who comes in today for a  pap smear only. Her most recent annual exam was about 1 yr ago but she had hysterectomy more than 2 yrs ago for fertility prevention purpose and since then has not had a PAP but was told she needed to get PAP done every 7 months after surgery. She stated the other reason for her hysterectomy was for uterine prolapse,but no diagnosis of cancer. Her most recent Pap smear was about 2 yrs ago or more and showed no abnormalities. Previous abnormal Pap smears: no. Contraception: status post hysterectomy.   Current Outpatient Prescriptions on File Prior to Visit  Medication Sig Dispense Refill  . cetirizine (ZYRTEC) 10 MG tablet Take 1 tablet (10 mg total) by mouth daily.  30 tablet  3  . fluconazole (DIFLUCAN) 150 MG tablet Take 1 tablet (150 mg total) by mouth once.  1 tablet  0  . fluticasone (FLONASE) 50 MCG/ACT nasal spray Place 2 sprays into both nostrils daily.  16 g  6  . ibuprofen (ADVIL,MOTRIN) 600 MG tablet Take 1 tablet (600 mg total) by mouth every 8 (eight) hours as needed.  30 tablet  1   No current facility-administered medications on file prior to visit.   History reviewed. No pertinent past medical history.   The following portions of the patient's history were reviewed and updated as appropriate: allergies, current medications, past family history, past medical history, past social history, past surgical history and problem list.  Review of Systems Pertinent items are noted in HPI.   Objective:    BP 115/59  Pulse 82  Temp(Src) 99.1 F (37.3 C) (Oral)  Wt 157 lb 14.4 oz (71.623 kg) Pelvic Exam: cervix normal in appearance, external genitalia normal and vagina normal without discharge. Pap smear obtained.  Physical Exam  Vitals reviewed. Constitutional: She appears well-developed.  Cardiovascular: Normal rate, regular rhythm  and normal heart sounds.   No murmur heard. Pulmonary/Chest: Effort normal and breath sounds normal. No respiratory distress. She has no wheezes.  Genitourinary:  Cervix absent,mild vaginal wall prolapse.    Assessment:    Screening pap smear.   Plan:    Follow up in 1 year for routine gynecologic exam without PAP .

## 2013-06-15 ENCOUNTER — Ambulatory Visit: Payer: No Typology Code available for payment source | Attending: Internal Medicine

## 2013-08-21 ENCOUNTER — Ambulatory Visit: Payer: Self-pay | Attending: Internal Medicine | Admitting: Internal Medicine

## 2013-08-21 ENCOUNTER — Encounter: Payer: Self-pay | Admitting: Internal Medicine

## 2013-08-21 VITALS — BP 100/71 | HR 78 | Temp 98.1°F | Resp 16 | Ht 58.27 in | Wt 166.8 lb

## 2013-08-21 DIAGNOSIS — M25521 Pain in right elbow: Secondary | ICD-10-CM

## 2013-08-21 DIAGNOSIS — M25529 Pain in unspecified elbow: Secondary | ICD-10-CM | POA: Insufficient documentation

## 2013-08-21 MED ORDER — CYCLOBENZAPRINE HCL 10 MG PO TABS: 10.0000 mg | ORAL_TABLET | Freq: Two times a day (BID) | ORAL | Status: DC | PRN

## 2013-08-21 MED ORDER — IBUPROFEN 600 MG PO TABS: 600.0000 mg | ORAL_TABLET | Freq: Two times a day (BID) | ORAL | Status: DC | PRN

## 2013-08-21 NOTE — Patient Instructions (Signed)
Elbow Exercises °EXERCISES °RANGE OF MOTION (ROM) AND STRETCHING EXERCISES  °These exercises may help you when beginning to rehabilitate your injury. Your symptoms may go away with or without further involvement from your physician, physical therapist or athletic trainer. While completing these exercises, remember:  °· Restoring tissue flexibility helps normal motion to return to the joints. This allows healthier, less painful movement and activity. °· An effective stretch should be held for at least 30 seconds. °· A stretch should never be painful. You should only feel a gentle lengthening or release in the stretched tissue. °RANGE OF MOTION - Extension °· Hold your right / left arm at your side and straighten your elbow as far as you can, using your right / left arm muscles. °· Straighten the elbow farther by gently pushing down on your forearm until you feel a gentle stretch on the inside of your elbow. Hold this position for __________ seconds. °· Slowly return to the starting position. °Repeat __________ times. Complete this exercise __________ times per day.  °RANGE OF MOTION - Flexion °· Hold your right / left arm at your side and bend your elbow as far as you can using your arm muscles. °· Bend the right / left elbow farther by gently pushing up on your forearm until you feel a gentle stretch on the outside of your elbow. Hold this position for __________ seconds. °· Slowly return to the starting position. °Repeat __________ times. Complete this exercise __________ times per day.  °RANGE OF MOTION - Supination, Active °· Stand or sit with your elbows at your side. Bend your right / left elbow to 90 degrees. °· Turn your palm upward until you feel a gentle stretch on the inside of your forearm. °· Hold this position for __________ seconds. Slowly release and return to the starting position. °Repeat __________ times. Complete this stretch __________ times per day.  °RANGE OF MOTION - Pronation, Active °· Stand  or sit with your elbows at your side. Bend your right / left elbow to 90 degrees. °· Turn your palm downward until you feel a gentle stretch on the top of your forearm. °· Hold this position for __________ seconds. Slowly release and return to the starting position. °Repeat __________ times. Complete this stretch __________ times per day.  °STRETCH - Elbow Flexors °· Lie on a firm bed or countertop, on your back. Be sure that you are in a comfortable position which will allow you to relax your arm muscles. °· Place a folded towel under your right / left upper arm, so that your elbow and shoulder are at the same height. Extend your arm; your elbow should not rest on the bed or towel °· Allow the weight of your hand to straighten your elbow. Keep your arm and chest muscles relaxed. Your caregiver may ask you to increase the intensity of your stretch by adding a small wrist or hand weight. °· Hold for __________ seconds. You should feel a stretch on the inside of your elbow. Slowly return to the starting position. °Repeat __________ times. Complete this exercise __________ times per day. °STRENGTHENING EXERCISES  °These exercises may help you when beginning to rehabilitate your injury. They may resolve your symptoms with or without further involvement from your physician, physical therapist or athletic trainer. While completing these exercises, remember:  °· Muscles can gain both the endurance and the strength needed for everyday activities through controlled exercises. °· Complete these exercises as instructed by your physician, physical therapist or athletic   trainer. Increase the resistance and repetitions only as guided. °· You may experience muscle soreness or fatigue, but the pain or discomfort you are trying to eliminate should never worsen during these exercises. If this pain does get worse, stop and make sure you are following the directions exactly. If the pain is still present after adjustments, discontinue  the exercise until you can discuss the trouble with your caregiver. °STRENGTH - Elbow Flexors, Isometric  °· Stand or sit upright on a firm surface. Place your right / left arm so that your hand is palm-up and at the height of your waist. °· Place your opposite hand on top of your forearm. Gently push down as your right / left arm resists. Push as hard as you can with both arms without causing any pain or movement at your right / left elbow. Hold this stationary position for __________ seconds. °· Gradually release the tension in both arms. Allow your muscles to relax completely before repeating. °Repeat __________ times. Complete this exercise __________ times per day. °STRENGTH - Elbow Extensors, Isometric °· Stand or sit upright on a firm surface. Place your right / left arm so that your palm faces your abdomen and it is at the height of your waist. °· Place your opposite hand on the underside of your forearm. Gently push up as your right / left arm resists. Push as hard as you can with both arms without causing any pain or movement at your right / left elbow. Hold this stationary position for __________ seconds. °· Gradually release the tension in both arms. Allow your muscles to relax completely before repeating. °Repeat __________ times. Complete this exercise __________ times per day. °STRENGTH - Elbow Flexors, Supinated °· With good posture, stand, or sit on a firm chair without armrests. Allow your right / left arm to rest at your side with your palm facing forward. °· Holding a __________ weight, or gripping a rubber exercise band or tubing, bring your hand toward your shoulder. °· Allow your muscles to control the resistance, as your hand returns to your side. °Repeat __________ times. Complete this exercise __________ times per day.  °STRENGTH - Elbow Extensors, Dynamic °· With good posture, stand, or sit on a firm chair without armrests. Keeping your upper arms at your side, bring both hands up toward  your right / left shoulder while gripping a rubber exercise band or tubing. Your right / left hand should be just below the other hand. °· Straighten your right / left elbow. Hold for __________ seconds. °· Allow your muscles to control the rubber exercise band, as your hand returns to your shoulder. °Repeat __________ times. Complete this exercise __________ times per day. °STRENGTH - Forearm Supinators  °· Sit with your right / left forearm supported on a table, keeping your elbow below shoulder height. Rest your hand over the edge, palm down. °· Gently grip a hammer or a soup ladle. °· Without moving your elbow, slowly turn your palm and hand upward to a "thumbs-up" position. °· Hold this position for __________ seconds. Slowly return to the starting position. °Repeat __________ times. Complete this exercise __________ times per day.  °STRENGTH - Forearm Pronators °· Sit with your right / left forearm supported on a table, keeping your elbow below shoulder height. Rest your hand over the edge, palm up. °· Gently grip a hammer or a soup ladle. °· Without moving your elbow, slowly turn your palm and hand upward to a "thumbs-up" position. °· Hold this   position for __________ seconds. Slowly return to the starting position. °Repeat __________ times. Complete this exercise __________ times per day. °Document Released: 11/25/2004 Document Revised: 04/05/2011 Document Reviewed: 04/25/2008 °[employer]® Patient Information ©2015 [employer]. This information is not intended to replace advice given to you by your health care provider. Make sure you discuss any questions you have with your health care provider. ° °

## 2013-08-21 NOTE — Progress Notes (Signed)
Patient ID: Shelby Mccann, female   DOB: 10-16-1970, 43 y.o.   MRN: [id number]  CC: right elbow pain  HPI:  Patient presents to clinic today with c/o right elbow pain for past two months.  Patient reports that she was painting her deck and began to have pain in her elbow. She states that if she open or close her hand she notices a pain that shoots up to her elbow and shoulder.  She reports weakness of her right hand as well.  She denies redness, warmth, or inflammation of that elbow. She has tried epsom salt rub, tylenol, ginger, and glucosamine chondritis for pain without relief.  Patient reports that she has been taking naproxen BID without relief.  No Known Allergies History reviewed. No pertinent past medical history. Current Outpatient Prescriptions on File Prior to Visit  Medication Sig Dispense Refill  . cetirizine (ZYRTEC) 10 MG tablet Take 1 tablet (10 mg total) by mouth daily.  30 tablet  3  . fluticasone (FLONASE) 50 MCG/ACT nasal spray Place 2 sprays into both nostrils daily.  16 g  6  . fluconazole (DIFLUCAN) 150 MG tablet Take 1 tablet (150 mg total) by mouth once.  1 tablet  0  . ibuprofen (ADVIL,MOTRIN) 600 MG tablet Take 1 tablet (600 mg total) by mouth every 8 (eight) hours as needed.  30 tablet  1   No current facility-administered medications on file prior to visit.   Family History  Problem Relation Age of Onset  . Cancer Mother     utetrine cancer   . Cancer Brother    History   Social History  . Marital Status: Single    Spouse Name: N/A    Number of Children: N/A  . Years of Education: N/A   Occupational History  . Not on file.   Social History Main Topics  . Smoking status: Never Smoker   . Smokeless tobacco: Not on file  . Alcohol Use: No  . Drug Use: Not on file  . Sexual Activity: Not on file   Other Topics Concern  . Not on file   Social History Narrative  . No narrative on file    Review of Systems: See hpi   Objective:   Filed Vitals:    08/21/13 1639  BP: 100/71  Pulse: 78  Temp: 98.1 F (36.7 C)  Resp: 16    Physical Exam: Constitutional: Patient appears well-developed and well-nourished. No distress. Neck: Normal ROM. Neck supple. No JVD. No tracheal deviation. No thyromegaly. CVS: RRR, S1/S2 +, no murmurs, no gallops, no carotid bruit.  Pulmonary: Effort and breath sounds normal, no stridor, rhonchi, wheezes, rales.   Musculoskeletal: Normal range of motion. No edema and no tenderness.  Lymphadenopathy: No lymphadenopathy noted, cervical Neuro: Alert. Normal reflexes, muscle tone coordination. Skin: Skin is warm and dry. No rash noted. Not diaphoretic. No erythema. No pallor. Psychiatric: Normal mood and affect. Behavior, judgment, thought content normal.  Lab Results  Component Value Date   WBC 7.1 01/17/2013   HGB 13.4 01/17/2013   HCT 38.6 01/17/2013   MCV 82.5 01/17/2013   PLT 322 01/17/2013   Lab Results  Component Value Date   CREATININE 0.79 01/17/2013   BUN 11 01/17/2013   NA 140 01/17/2013   K 4.2 01/17/2013   CL 104 01/17/2013   CO2 28 01/17/2013    No results found for this basename: HGBA1C   Lipid Panel     Component Value Date/Time   CHOL  178 01/17/2013 1340   TRIG 163* 01/17/2013 1340   HDL 45 01/17/2013 1340   CHOLHDL 4.0 01/17/2013 1340   VLDL 33 01/17/2013 1340   LDLCALC 100* 01/17/2013 1340       Assessment and plan:   Shelby Mccann was seen today for elbow injury.  Diagnoses and associated orders for this visit:  Right elbow pain - ibuprofen (ADVIL,MOTRIN) 600 MG tablet; Take 1 tablet (600 mg total) by mouth 2 (two) times daily as needed. - cyclobenzaprine (FLEXERIL) 10 MG tablet; Take 1 tablet (10 mg total) by mouth 2 (two) times daily as needed for muscle spasms.   Return if symptoms worsen or fail to improve.      Holland Commons, NP-C [facility] [phone] 08/21/2013, 4:55 PM

## 2013-08-21 NOTE — Progress Notes (Signed)
Patient presents for 2 month history of right elbow pain and swelling Extending to right shoulder; Rates 3/10 at present States pain increases with movement and when opening and closing hand. Has been using Arthritis epsom salt, glucosomine and chondroitin,  topical analgesic  and naproxen with some relief. States this began after painting small deck

## 2013-11-30 ENCOUNTER — Ambulatory Visit: Payer: Self-pay | Admitting: Internal Medicine

## 2013-12-03 ENCOUNTER — Ambulatory Visit: Payer: Self-pay | Admitting: Internal Medicine

## 2014-01-07 ENCOUNTER — Ambulatory Visit: Payer: Self-pay | Attending: Internal Medicine

## 2014-01-07 DIAGNOSIS — E785 Hyperlipidemia, unspecified: Secondary | ICD-10-CM

## 2014-01-07 LAB — LIPID PANEL
CHOL/HDL RATIO: 3.6 ratio
Cholesterol: 179 mg/dL (ref 0–200)
HDL: 50 mg/dL (ref >39)
LDL Cholesterol: 102 mg/dL — ABNORMAL HIGH (ref 0–99)
TRIGLYCERIDES: 134 mg/dL (ref <150)
VLDL: 27 mg/dL (ref 0–40)

## 2014-01-09 ENCOUNTER — Ambulatory Visit: Payer: Self-pay | Attending: Internal Medicine | Admitting: Internal Medicine

## 2014-01-09 ENCOUNTER — Encounter: Payer: Self-pay | Admitting: Internal Medicine

## 2014-01-09 VITALS — BP 104/71 | HR 85 | Temp 97.9°F | Resp 18 | Ht <= 58 in | Wt 167.0 lb

## 2014-01-09 DIAGNOSIS — Z791 Long term (current) use of non-steroidal anti-inflammatories (NSAID): Secondary | ICD-10-CM | POA: Insufficient documentation

## 2014-01-09 DIAGNOSIS — M62830 Muscle spasm of back: Secondary | ICD-10-CM | POA: Insufficient documentation

## 2014-01-09 DIAGNOSIS — R109 Unspecified abdominal pain: Secondary | ICD-10-CM

## 2014-01-09 DIAGNOSIS — E781 Pure hyperglyceridemia: Secondary | ICD-10-CM | POA: Insufficient documentation

## 2014-01-09 LAB — POCT URINALYSIS DIPSTICK
BILIRUBIN UA: NEGATIVE
Glucose, UA: NEGATIVE
KETONES UA: NEGATIVE
Leukocytes, UA: NEGATIVE
Nitrite, UA: NEGATIVE
PH UA: 7
Protein, UA: NEGATIVE
RBC UA: NEGATIVE
Spec Grav, UA: 1.01
Urobilinogen, UA: 0.2

## 2014-01-09 MED ORDER — CYCLOBENZAPRINE HCL 10 MG PO TABS: 10.0000 mg | ORAL_TABLET | Freq: Two times a day (BID) | ORAL | Status: DC | PRN

## 2014-01-09 MED ORDER — IBUPROFEN 600 MG PO TABS: 600.0000 mg | ORAL_TABLET | Freq: Two times a day (BID) | ORAL | Status: DC | PRN

## 2014-01-09 NOTE — Progress Notes (Signed)
F/U Lipids blood work Complain Rt back pain radiating to front. No burning, pain or frequency urination Pain worsen at night

## 2014-01-09 NOTE — Progress Notes (Signed)
MRN: [id number] Name: Shelby Mccann  Sex: female Age: 43 y.o. DOB: February 07, 1970  Allergies: Review of patient's allergies indicates no known allergies.  Chief Complaint  Patient presents with  . Follow-up    HPI: Patient is 43 y.o. female who comes today complaining of her right lower back/flank pain for the last few days, patient denies any nausea vomiting any urinary frequency urgency denies any fever chills, she denies any recent fall or trauma, the pain is dull nonradiating 5-0/27 certain posture make the pain worse, patient has not taken any medication.  History reviewed. No pertinent past medical history.  Past Surgical History  Procedure Laterality Date  . Abdominal hysterectomy        Medication List       This list is accurate as of: 01/09/14  5:33 PM.  Always use your most recent med list.               cetirizine 10 MG tablet  Commonly known as:  ZYRTEC  Take 1 tablet (10 mg total) by mouth daily.     cyclobenzaprine 10 MG tablet  Commonly known as:  FLEXERIL  Take 1 tablet (10 mg total) by mouth 2 (two) times daily as needed for muscle spasms.     fluconazole 150 MG tablet  Commonly known as:  DIFLUCAN  Take 1 tablet (150 mg total) by mouth once.     fluticasone 50 MCG/ACT nasal spray  Commonly known as:  FLONASE  Place 2 sprays into both nostrils daily.     ibuprofen 600 MG tablet  Commonly known as:  ADVIL,MOTRIN  Take 1 tablet (600 mg total) by mouth 2 (two) times daily as needed.     naproxen sodium 220 MG tablet  Commonly known as:  ANAPROX  Take 440 mg by mouth 2 (two) times daily with a meal.        Meds ordered this encounter  Medications  . ibuprofen (ADVIL,MOTRIN) 600 MG tablet    Sig: Take 1 tablet (600 mg total) by mouth 2 (two) times daily as needed.    Dispense:  60 tablet    Refill:  1  . cyclobenzaprine (FLEXERIL) 10 MG tablet    Sig: Take 1 tablet (10 mg total) by mouth 2 (two) times daily as needed for muscle spasms.   Dispense:  60 tablet    Refill:  1     There is no immunization history on file for this patient.  Family History  Problem Relation Age of Onset  . Cancer Mother     utetrine cancer   . Cancer Brother     History  Substance Use Topics  . Smoking status: Never Smoker   . Smokeless tobacco: Not on file  . Alcohol Use: No    Review of Systems   As noted in HPI  Filed Vitals:   01/09/14 1707  BP: 104/71  Pulse: 85  Temp: 97.9 F (36.6 C)  Resp: 18    Physical Exam  Physical Exam  Constitutional: No distress.  Eyes: EOM are normal. Pupils are equal, round, and reactive to light.  Cardiovascular: Normal rate and regular rhythm.   Pulmonary/Chest: Breath sounds normal. No respiratory distress. She has no wheezes. She has no rales.  Musculoskeletal:  Right lower lumbar paraspinal tenderness, SLR test negative     CBC    Component Value Date/Time   WBC 7.1 01/17/2013 1340   RBC 4.68 01/17/2013 1340   HGB 13.4 01/17/2013  1340   HCT 38.6 01/17/2013 1340   PLT 322 01/17/2013 1340   MCV 82.5 01/17/2013 1340   LYMPHSABS 2.1 01/17/2013 1340   MONOABS 0.5 01/17/2013 1340   EOSABS 0.1 01/17/2013 1340   BASOSABS 0.0 01/17/2013 1340    CMP     Component Value Date/Time   NA 140 01/17/2013 1340   K 4.2 01/17/2013 1340   CL 104 01/17/2013 1340   CO2 28 01/17/2013 1340   GLUCOSE 90 01/17/2013 1340   BUN 11 01/17/2013 1340   CREATININE 0.79 01/17/2013 1340   CREATININE 0.74 12/27/2009 1512   CALCIUM 9.4 01/17/2013 1340   PROT 7.1 01/17/2013 1340   ALBUMIN 4.1 01/17/2013 1340   AST 21 01/17/2013 1340   ALT 27 01/17/2013 1340   ALKPHOS 95 01/17/2013 1340   BILITOT 0.2* 01/17/2013 1340   GFRNONAA >89 01/17/2013 1340   GFRNONAA >60 12/27/2009 1512   GFRAA >89 01/17/2013 1340   GFRAA  12/27/2009 1512    >60        The eGFR has been calculated using the MDRD equation. This calculation has not been validated in all clinical situations. eGFR's  persistently <60 mL/min signify possible Chronic Kidney Disease.    Lab Results  Component Value Date/Time   CHOL 179 01/07/2014 09:10 AM    No components found for: HGA1C  Lab Results  Component Value Date/Time   AST 21 01/17/2013 01:40 PM    Assessment and Plan  Flank pain - Plan:  Results for orders placed or performed in visit on 01/09/14  Urinalysis Dipstick  Result Value Ref Range   Color, UA YELLOW    Clarity, UA CLEAR    Glucose, UA NEGATIVE    Bilirubin, UA NEGATIVE    Ketones, UA NEGATIVE    Spec Grav, UA 1.010    Blood, UA NEGATIVE    pH, UA 7.0    Protein, UA NEGATIVE    Urobilinogen, UA 0.2    Nitrite, UA NEGATIVE    Leukocytes, UA Negative    Urinalysis Dipstick, Is negative for infection, likely muscular pain  Hypertriglyceridemia Recent lipid panel reviewed with the patient, triglycerides are improved continue with low-fat diet.  Back muscle spasm - Plan: ibuprofen (ADVIL,MOTRIN) 600 MG tablet, cyclobenzaprine (FLEXERIL) 10 MG tablet   Health Maintenance  -Vaccinations:  -patient declines flu shot   Return in about 6 months (around 07/11/2014), or if symptoms worsen or fail to improve.  Lorayne Marek, MD

## 2014-01-14 ENCOUNTER — Ambulatory Visit: Payer: Self-pay

## 2014-01-14 ENCOUNTER — Ambulatory Visit: Payer: Self-pay | Attending: Internal Medicine | Admitting: Internal Medicine

## 2014-01-14 DIAGNOSIS — R05 Cough: Secondary | ICD-10-CM | POA: Insufficient documentation

## 2014-01-14 DIAGNOSIS — R0981 Nasal congestion: Secondary | ICD-10-CM

## 2014-01-14 DIAGNOSIS — J029 Acute pharyngitis, unspecified: Secondary | ICD-10-CM | POA: Insufficient documentation

## 2014-01-14 LAB — POCT RAPID STREP A (OFFICE): Rapid Strep A Screen: NEGATIVE

## 2014-01-14 MED ORDER — AZITHROMYCIN 250 MG PO TABS: ORAL_TABLET | ORAL | Status: DC

## 2014-01-14 MED ORDER — FLUTICASONE PROPIONATE 50 MCG/ACT NA SUSP: 2.0000 | Freq: Every day | NASAL | Status: DC

## 2014-01-14 NOTE — Progress Notes (Signed)
Spoke with patient via [employer], [place], [place] [phone] Patient walked in c/o 4 day hx of dry cough that's waking her at night; states she hasn't slept in 2 nights due to cough Also c/o body aches, sore throat, sinus pain, PND, nasal congestion, and green nasal drainage  Patient denies fever, chills, ear pain, fatigue or weakness Patient states she has never smoked Patient states she will return for flu vaccine once she is feeling better  BP 106/66 P 103  T  98.3 oral R 20 SPO2 99%  No tenderness noted upon palpation of maxillary or frontal sinuses No cervical lymphadenopathy noted Ears: canals clear without redness or drainage; tympanic membrane intact without redness or bulging Nose: nasal turbinates engorged bilaterally Throat: clear with redness but without drainage Lungs: clear to auscultation with good breath sounds noted  Per PCP: Z-pak flonase nasal spray 2 puffs daily Salt water gargles  Patient provided literature on pharyngitis and cough in spanish  Patient encouraged to rest, drink plenty of fluids (water) Call back if sx worsen or fail to improve   Agree with RN's assessment and intervention  Doris Cheadleeepak Advani, MD

## 2014-01-14 NOTE — Patient Instructions (Signed)
Tos - Adultos  (Cough, Adult)  La tos es un reflejo que ayuda a limpiar las vas areas y Administratorla garganta. Puede ayudar a curar el organismo o ser Neomia Dearuna reaccin a un irritante. La tos puede durar General Electricentre 2  3 semanas (aguda) o puede durar ms de 8 semanas (crnica)  CAUSAS  Tos aguda:   Infecciones virales o bacterianas. Tos crnica.   Infecciones.  Alergias.  Asma.  Goteo post nasal.  El hbito de fumar.  Acidez o reflujo gstrico.  Algunos medicamentos.  Problemas pulmonares crnicos  Cncer. SNTOMAS   Tos.  Grant RutsFiebre.  Dolor en el pecho.  Aumento en el ritmo respiratorio.  Ruidos agudos al respirar (sibilancias).  Moco coloreado al toser (esputo). TRATAMIENTO   Un tos de causa bacteriana puede tratarse con antibiticos.  La tos de origen viral debe seguir su curso y no responde a los antibiticos.  El mdico podr recomendar otros tratamientos si tiene tos crnica. INSTRUCCIONES PARA EL CUIDADO EN EL HOGAR   Solo tome medicamentos que se pueden comprar sin receta o recetados para Chief Technology Officerel dolor, Dentistmalestar o fiebre, como le indica el mdico. Utilice antitusivos slo en la forma indicada por el mdico.  Use un vaporizador o humidificador de niebla fra en la habitacin para ayudar a aflojar las secreciones.  Duerma en posicin semi erguida si la tos empeora por la noche.  Descanse todo lo que pueda.  Si fuma, abandone el hbito. SOLICITE ATENCIN MDICA DE INMEDIATO SI:   Observa pus en el esputo.  La tos empeora.  No puede controlar la tos con antitusivos y no puede dormir debido a Secretary/administratorello.  Comienza a escupir sangre al toser.  Tiene dificultad para respirar.  El dolor empeora o no puede controlarlo con los medicamentos.  Tiene fiebre. ASEGRESE DE QUE:   Comprende estas instrucciones.  Controlar su enfermedad.  Solicitar ayuda de inmediato si no mejora o si empeora. Document Released: 08/19/2010 Document Revised: 04/05/2011 [facility]Exit[facility] Patient  Information 2015 [place], [place]. This information is not intended to replace advice given to you by your health care provider. Make sure you discuss any questions you have with your health care provider. Faringitis (Pharyngitis) La faringitis ocurre cuando la faringe presenta enrojecimiento, dolor e hinchazn (inflamacin).  CAUSAS  Normalmente, la faringitis se debe a una infeccin. Generalmente, estas infecciones ocurren debido a virus (viral) y se presentan cuando las personas se resfran. Sin embargo, a Advertising account executiveveces la faringitis es provocada por bacterias (bacteriana). Las alergias tambin pueden ser una causa de la faringitis. La faringitis viral se puede contagiar de Neomia Dearuna persona a otra al toser, estornudar y compartir objetos o utensilios personales (tazas, tenedores, cucharas, cepillos de diente). La faringitis bacteriana se puede contagiar de Neomia Dearuna persona a otra a travs de un contacto ms ntimo, como besar.  SIGNOS Y SNTOMAS  Los sntomas de la faringitis incluyen los siguientes:   Dolor de Advertising copywritergarganta.  Cansancio (fatiga).  Fiebre no muy elevada.  Dolor de Turkmenistancabeza.  Dolores musculares y en las articulaciones.  Erupciones cutneas  Ganglios linfticos hinchados.  Una pelcula parecida a las placas en la garganta o las amgdalas (frecuente con la faringitis bacteriana). DIAGNSTICO  El mdico le har preguntas sobre la enfermedad y sus sntomas. Normalmente, todo lo que se necesita para diagnosticar una faringitis son sus antecedentes mdicos y un examen fsico. A veces se realiza una prueba rpida para estreptococos. Tambin es posible que se realicen otros anlisis de laboratorio, segn la posible causa.  TRATAMIENTO  La faringitis  viral normalmente mejorar en un plazo de 3 a 4das sin medicamentos. La faringitis bacteriana se trata con medicamentos que McGraw-Hillmatan los grmenes (antibiticos).  INSTRUCCIONES PARA EL CUIDADO EN EL HOGAR   Beba gran cantidad de lquido para mantener la orina  de tono claro o color amarillo plido.  Tome solo medicamentos de venta libre o recetados, segn las indicaciones del mdico.  Si le receta antibiticos, asegrese de terminarlos, incluso si comienza a Actorsentirse mejor.  No tome aspirina.  Descanse lo suficiente.  Hgase grgaras con 8onzas (227ml) de agua con sal (cucharadita de sal por litro de agua) cada 1 o 2horas para Science writercalmar la garganta.  Puede usar pastillas (si no corre riesgo de Health visitorahogarse) o aerosoles para Science writercalmar la garganta. SOLICITE ATENCIN MDICA SI:   Tiene bultos grandes y dolorosos en el cuello.  Tiene una erupcin cutnea.  Cuando tose elimina una expectoracin verde, amarillo amarronado o con Elkinsangre. SOLICITE ATENCIN MDICA DE INMEDIATO SI:   El cuello se pone rgido.  Comienza a babear o no puede tragar lquidos.  Vomita o no puede retener los American International Groupmedicamentos ni los lquidos.  Siente un dolor intenso que no se alivia con los medicamentos recomendados.  Tiene dificultades para respirar (y no debido a la nariz tapada). ASEGRESE DE QUE:   Comprende estas instrucciones.  Controlar su afeccin.  Recibir ayuda de inmediato si no mejora o si empeora. Document Released: 10/21/2004 Document Revised: 11/01/2012 [facility] Patient Information 2015 [place]ExitCare, [place]LL[place]. This information is not intended to replace advice given to you by your health care provider. Make sure you discuss any questions you have with your health care provider.

## 2014-04-24 ENCOUNTER — Emergency Department (INDEPENDENT_AMBULATORY_CARE_PROVIDER_SITE_OTHER)
Admission: EM | Admit: 2014-04-24 | Discharge: 2014-04-24 | Disposition: A | Discharge status: Home / Self Care | Payer: Self-pay | Source: Home / Self Care | Attending: Family Medicine | Admitting: Family Medicine

## 2014-04-24 ENCOUNTER — Encounter (HOSPITAL_COMMUNITY): Payer: Self-pay | Admitting: Emergency Medicine

## 2014-04-24 DIAGNOSIS — H1013 Acute atopic conjunctivitis, bilateral: Secondary | ICD-10-CM

## 2014-04-24 DIAGNOSIS — J309 Allergic rhinitis, unspecified: Principal | ICD-10-CM

## 2014-04-24 DIAGNOSIS — R05 Cough: Secondary | ICD-10-CM

## 2014-04-24 DIAGNOSIS — R059 Cough, unspecified: Secondary | ICD-10-CM

## 2014-04-24 MED ORDER — FLUTICASONE PROPIONATE 50 MCG/ACT NA SUSP: 2.0000 | Freq: Every day | NASAL | Status: DC

## 2014-04-24 MED ORDER — TRAMADOL HCL 50 MG PO TABS: 50.0000 mg | ORAL_TABLET | Freq: Every evening | ORAL | Status: DC | PRN

## 2014-04-24 MED ORDER — KETOTIFEN FUMARATE 0.025 % OP SOLN: 1.0000 [drp] | Freq: Two times a day (BID) | OPHTHALMIC | Status: DC

## 2014-04-24 MED ORDER — CETIRIZINE HCL 10 MG PO TABS: 10.0000 mg | ORAL_TABLET | Freq: Every day | ORAL | Status: DC

## 2014-04-24 NOTE — ED Notes (Signed)
C/o allergies onset 1 week Sx include dry cough, itchy throat and eyes, runny nose Taking allegra w/no relief Alert, no signs of acute distress.

## 2014-04-24 NOTE — ED Provider Notes (Signed)
Shelby Mccann is a 44 y.o. female who presents to Urgent Care today for cough congestion runny nose and itchy watery eyes sneezing. Symptoms present for a week. Patient is use intermittent Allegra which helped only a little. No fevers or chills nausea vomiting or diarrhea.    No past medical history on file. No past surgical history on file. History  Substance Use Topics  . Smoking status: Not on file  . Smokeless tobacco: Not on file  . Alcohol Use: Not on file   ROS as above Medications: No current facility-administered medications for this encounter.   Current Outpatient Prescriptions  Medication Sig Dispense Refill  . cetirizine (ZYRTEC) 10 MG tablet Take 1 tablet (10 mg total) by mouth daily. spanish 30 tablet 12  . fluticasone (FLONASE) 50 MCG/ACT nasal spray Place 2 sprays into both nostrils daily. spanish 16 g 12  . ketotifen (ZADITOR) 0.025 % ophthalmic solution Place 1 drop into both eyes 2 (two) times daily. spanish 5 mL 12  . traMADol (ULTRAM) 50 MG tablet Take 1 tablet (50 mg total) by mouth at bedtime as needed (cough). spanish 10 tablet 0   Allergies not on file   Exam:  BP 104/70 mmHg  Pulse 98  Temp(Src) 98.4 F (36.9 C) (Oral)  Resp 16  SpO2 97% Gen: Well NAD HEENT: EOMI,  MMM clear nasal discharge and no significant conjunctival injection. Inflamed nasal turbinates with clear discharge bilaterally. Posterior pharynx with cobblestoning. Normal tympanic membranes. Lungs: Normal work of breathing. CTABL Heart: RRR no MRG Abd: NABS, Soft. Nondistended, Nontender Exts: Brisk capillary refill, warm and well perfused.   No results found for this or any previous visit (from the past 24 hour(s)). No results found.  Assessment and Plan: 44 y.o. female with allergic rhinitis and conjunctivitis. Treat with Zyrtec, Flonase nasal spray, Zaditor, and tramadol for cough. F/u prn.   Discussed warning signs or symptoms. Please see discharge instructions. Patient  expresses understanding.     Rodolph BongEvan S Corey, MD 04/24/14 [phone]

## 2014-04-24 NOTE — Discharge Instructions (Signed)
Thank you for coming in today. Call or go to the emergency room if you get worse, have trouble breathing, have chest pains, or palpitations.    Conjuntivitis alrgica (Allergic Conjunctivitis) La conjuntiva es una delgada membrana mucosa (secrecin) que cubre la parte visible del globo ocular y la parte interior de los prpados. Esta membrana protege y Wellslubrica el ojo. La membrana tiene pequeos vasos sanguneos que pueden verse normalmente. Cuando la conjuntiva se inflama, produce un trastorno denominado conjuntivitis. En respuesta a la inflamacin, los vasos sanguneos de la conjuntiva se hinchan. La hinchazn da como resultado enrojecimiento de la zona del ojo que normalmente es blanca. Cuando una persona es alrgica esta membrana reacciona, y por lo tanto a este trastorno se lo denomina conjuntivitis Counselling psychologistalrgica. El problema generalmente dura mientras persiste la alergia. La conjuntivitis alrgica no se transmite a Economistotras personas (no es contagiosa). La probabilidad de infeccin bacteriana es grande y Pleasant Runprobablemente no se deba a una alergia si en el ojo inflamado se observa:  Una secrecin pegajosa.  Secrecin o pegoteo de las pestaas por la Pontiacmaana.  Escamas en los prpados, en la zona en que se implantan las pestaas.  Hinchazn y enrojecimiento de los prpados CAUSAS  Virus.  Irritantes, como cuerpos extraos.  Sustancias qumicas.  Reacciones alrgicas.  Inflamacin o enfermedades graves de la zona interior o exterior del ojo o de la rbita (la cavidad sea en la que el ojo se inserta) pueden causar un "ojo rojo". SNTOMAS  Enrojecimiento ocular.  Lagrimeo.  Picazn.  Sensacin de ardor.  Secrecin acuosa.  Reaccin alrgica debido al polen o sensibilidad a la ambrosa. La conjuntivitis alrgica estacional es frecuente en primavera, cuando el polen se encuentra en el aire y tambin en otoo. DIAGNSTICO Este trastorno, en sus variadas formas, se diagnostica segn la  historia clnica y el examen oftalmolgico. Generalmente implica ambos ojos Si sus ojos reaccionan al Toll Brothersmismo tiempo todos los aos, la causa podra ser Vella Raringuna alergia. La mayora de los casos de enrojecimiento ocular se deben a Runner, broadcasting/film/videouna reaccin alrgica o una infeccin, pero es muy importante el diagnstico oftalmolgico. El examen puede descartar enfermedades graves del ojo o de la rbita. TRATAMIENTO  Podrn prescribirle gotas oftlmicas sin antibitico, ungentos o medicamentos por va oral, si el oftalmlogo est seguro que la conjuntivitis slo se debe a una alergia.  Las gotas y ungentos de venta libre para los sntomas alrgicos deben usarse slo despus que se hayan descartado otras causas de conjuntivitis, o segn las indicaciones del mdico. Los medicamentos por boca generalmente se utilizan si tambin existen otros Artistproblemas alrgicos. Si el oftalmlogo est seguro que el medicamento se debe slo a una Louisburgalergia, el tratamiento se limita a gotas o ungentos para reducir Haematologistla picazn o el ardor. INSTRUCCIONES PARA EL CUIDADO DOMICILIARIO  Lvese las manos antes y despus de Contractoraplicar gotas o ungentos, o de tocarse el ojo inflamado o los prpados.  No deje que la punta del gotero o del tubo del ungento toque el prpado al Scientist, product/process developmentcolocar el medicamento en el ojo.  Suspenda el uso de las lentes de contacto blandas y descrtelas. Use un nuevo par de lentes cuando se haya recuperado completamente. Si va a utilizar nuevamente las mismas lentes de contacto, complete los ciclos de esterilizacin al menos tres veces. Debe suspender el uso de las lentes de contacto duras. Deben ser cuidadosamente esterilizadas antes del uso, luego de la recuperacin.  La picazn y el ardor debido a alergias se Chief Executive Officeralivia colocando un pao fro  sobre el ojo cerrado. SOLICITE ATENCIN MDICA SI:   Los problemas no desaparecen luego de dos o [place].  Sus prpados estn pegajosos (especialmente en la maana al  despertarse), o pegados.  Tiene secreciones. Podr ser necesaria la administracin de antibiticos en forma de gotas, ungentos o por va oral.  Tiene extrema sensibilidad a la luz.  Presenta una temperatura oral superior a 38,9 C (102 F).  Siente dolor alrededor del ojo o desarrolla algn otro sntoma visual. EST SEGURO QUE:   Comprende las instrucciones para el alta mdica.  Controlar su enfermedad.  Solicitar atencin mdica de inmediato segn las indicaciones. Document Released: 01/11/2005 Document Revised: 04/05/2011 [facility] Patient Information 2015 [place], [place]. This information is not intended to replace advice given to you by your health care provider. Make sure you discuss any questions you have with your health care provider.  Rinitis alrgica (Allergic Rhinitis) La rinitis alrgica ocurre cuando las membranas mucosas de la nariz responden a los alrgenos. Los alrgenos son las partculas que estn en el aire y que hacen que el cuerpo tenga una reaccin Counselling psychologist. Esto hace que usted libere anticuerpos alrgicos. A travs de una cadena de eventos, estos finalmente hacen que usted libere histamina en la corriente sangunea. Aunque la funcin de la histamina es proteger al organismo, es esta liberacin de histamina lo que provoca malestar, como los estornudos frecuentes, la congestin y goteo y Control and instrumentation engineer.  CAUSAS  La causa de la rinitis Merchandiser, retail (fiebre del heno) son los alrgenos del polen que pueden provenir del csped, los rboles y Theme park manager. La causa de la rinitis IT consultant (rinitis alrgica perenne) son los alrgenos como los caros del polvo domstico, la caspa de las mascotas y las esporas del moho.  SNTOMAS   Secrecin nasal (congestin).  Goteo y picazn nasales con estornudos y Arboriculturist. DIAGNSTICO  Su mdico puede ayudarlo a Warehouse manager alrgeno o los alrgenos que desencadenan sus sntomas. Si usted y su mdico no pueden  Chief Strategy Officer cul es el alrgeno, pueden hacerse anlisis de sangre o estudios de la piel. TRATAMIENTO  La rinitis alrgica no tiene [place], pero puede controlarse mediante lo siguiente:  Medicamentos y vacunas contra la alergia (inmunoterapia).  Prevencin del alrgeno. La fiebre del heno a menudo puede tratarse con antihistamnicos en las formas de pldoras o aerosol nasal. Los antihistamnicos bloquean los efectos de la histamina. Existen medicamentos de venta libre que pueden ayudar con la congestin nasal y la hinchazn alrededor de los ojos. Consulte a su mdico antes de tomar o administrarse este medicamento.  Si la prevencin del alrgeno o el medicamento recetado no dan resultado, existen muchos medicamentos nuevos que su mdico puede recetarle. Pueden usarse medicamentos ms fuertes si las medidas iniciales no son efectivas. Pueden aplicarse inyecciones desensibilizantes si los medicamentos y la prevencin no funcionan. La desensibilizacin ocurre cuando un paciente recibe vacunas constantes hasta que el cuerpo se vuelve menos sensible al alrgeno. Asegrese de Medical sales representative seguimiento con su mdico si los problemas continan. INSTRUCCIONES PARA EL CUIDADO EN EL HOGAR No es posible evitar por completo los alrgenos, pero puede reducir los sntomas al tomar medidas para limitar su exposicin a ellos. Es muy til saber exactamente a qu es alrgico para que pueda evitar sus desencadenantes especficos. SOLICITE ATENCIN MDICA SI:   Lance Muss.  Desarrolla una tos que no se detiene fcilmente (persistente).  Le falta el aire.  Comienza a tener sibilancias.  Los sntomas interfieren con las actividades diarias normales.  Document Released: 10/21/2004 Document Revised: 11/01/2012 [facility] Patient Information 2015 [place], [place]. This information is not intended to replace advice given to you by your health care provider. Make sure you discuss any questions you have with your health care  provider.

## 2014-04-26 ENCOUNTER — Emergency Department (HOSPITAL_COMMUNITY): Payer: Self-pay

## 2014-04-26 ENCOUNTER — Emergency Department (HOSPITAL_COMMUNITY)
Admission: EM | Admit: 2014-04-26 | Discharge: 2014-04-26 | Disposition: A | Discharge status: Home / Self Care | Payer: Self-pay | Attending: Emergency Medicine | Admitting: Emergency Medicine

## 2014-04-26 ENCOUNTER — Encounter (HOSPITAL_COMMUNITY): Payer: Self-pay | Admitting: *Deleted

## 2014-04-26 DIAGNOSIS — R059 Cough, unspecified: Secondary | ICD-10-CM

## 2014-04-26 DIAGNOSIS — J209 Acute bronchitis, unspecified: Secondary | ICD-10-CM | POA: Insufficient documentation

## 2014-04-26 DIAGNOSIS — R05 Cough: Secondary | ICD-10-CM

## 2014-04-26 DIAGNOSIS — J40 Bronchitis, not specified as acute or chronic: Secondary | ICD-10-CM

## 2014-04-26 MED ORDER — AZITHROMYCIN 250 MG PO TABS
500.0000 mg | ORAL_TABLET | Freq: Once | ORAL | Status: AC
  Administered 2014-04-26: 500 mg via ORAL
  Filled 2014-04-26: qty 2

## 2014-04-26 MED ORDER — DM-GUAIFENESIN ER 30-600 MG PO TB12: 1.0000 | ORAL_TABLET | Freq: Two times a day (BID) | ORAL | Status: DC

## 2014-04-26 MED ORDER — BENZONATATE 100 MG PO CAPS
100.0000 mg | ORAL_CAPSULE | Freq: Once | ORAL | Status: AC
  Administered 2014-04-26: 100 mg via ORAL
  Filled 2014-04-26: qty 1

## 2014-04-26 MED ORDER — AZITHROMYCIN 250 MG PO TABS: 250.0000 mg | ORAL_TABLET | Freq: Every day | ORAL | Status: AC

## 2014-04-26 NOTE — Discharge Instructions (Signed)
Infeccin de las vas areas superiores en los adultos (Upper Respiratory Infection, Adult)  La infeccin respiratoria de las vas areas superiores se conoce tambin como resfro comn. Las vas areas superiores incluyen los senos nasales, la garganta, la trquea, y los bronquios. Los bronquios son las vas areas que conducen el aire a los pulmones. La mayor parte de las personas mejora luego de una semana, pero los sntomas pueden durar hasta dos semanas. La tos residual puede durar ms. CAUSAS Varios tipos de virus pueden causar la infeccin de los tejidos que cubren las vas areas superiores. Los tejidos se irritan y se inflaman y se originan secreciones. Tambin es frecuente la produccin de moco. El resfro es contagioso. El virus se disemina fcilmente a otras personas por contacto oral. Aqu se incluyen los besos, el compartir un vaso y el toser o estornudar. Tambin puede diseminarse tocndose la boca o la nariz y luego tocando una superficie que luego tocan otras personas.  SNTOMAS Los sntomas se desarrollan entre uno y tres das luego de entrar en contacto con el virus. Pueden variar de una persona a otra. Incluyen:  Secrecin nasal.  Estornudos  Congestin nasal.  Irritacin de los senos nasales.  Dolor de garganta.  Prdida de la voz (laringitis).  Tos.  Fatiga.  Dolores musculares.  Prdida del apetito.  Dolor de cabeza.  Fiebre no muy elevada. DIAGNSTICO Puede diagnosticarse a s mismo la infeccin respiratoria, segn los sntomas habituales, ya que la mayor parte de las personas se resfra dos o tres veces al ao. El profesional puede confirmarlo basndose en el examen fsico. Lo ms importante es que el profesional verifique que los sntomas no se deben a otra enfermedad como anginas, sinusitis, neumona, asma o epiglotitis. Para diagnosticar el resfrio comn, no es necesario que haga anlisis de sangre, pruebas en la garganta o radiografas, pero en algunos  casos puede ser de utilidad para excluir otros problemas ms graves. El mdico decidir si necesita otras pruebas. RIESGOS Y COMPLICACIONES Tendr mayor riesgo de sufrir un resfro grave si consume cigarrillos, sufre una enfermedad cardaca (como insuficiencia cardaca) o pulmonar crnica (como asma) o si tiene un debilitamiento del sistema inmunolgico. Las personas muy jvenes o muy mayores tienen riesgo de sufrir infecciones ms graves. La sinusitis bacteriana, las infecciones del odo medio y la neumona bacteriana pueden complicar el resfro comn. El resfro puede exacerbar el asma y la enfermedad pulmonar obstructiva crnica. En algunos casos estas complicaciones requieren la atencin en un servicio de emergencias y pueden poner en peligro la vida. PREVENCIN La mejor manera de protegerse para no contraer un resfro es mantener una buena higiene. Evite el contacto bucal o de las manos con personas con sntomas de resfro. Si se produce el contacto, lvese las manos con frecuencia. No hay pruebas firmes que indiquen que la vitamina C, la vitamina E, la equincea o la actividad fsica reduzcan las posibilidades de tener una infeccin. Sin embargo, siempre se recomienda descansar mucho y tener una buena nutricin. TRATAMIENTO El tratamiento est dirigido a aliviar los sntomas. Esta enfermedad no tiene cura. Los antibiticos no son eficaces, ya que esta infeccin la causa un virus y no una bacteria. El tratamiento incluye:  Aumente la ingesta de lquidos. Consumo de bebidas deportivas, que proporcionan electrolitos,azcares e hidratacin.  Inhale vapor caliente (de un vaporizador o de la ducha).  Tomar sopa de pollo u otros lquidos claros, y mantener una buena nutricin.  Descanse lo suficiente.  Haga grgaras o coma pastillas para aliviar   las molestias.  Control de la fiebre con ibuprofeno o acetaminofen, segn las indicaciones del mdico.  Aumento del uso del inhalador, si sufre asma. Las  pastillas y los geles de zinc durante las primeras 24 horas de iniciado el resfro comn, pueden disminuir la duracin y aliviar la gravedad de los sntomas. Los medicamentos para el dolor pueden disminuir la fiebre, aliviar los dolores musculares y el dolor de garganta. Se dispone de una gran variedad de medicamentos de venta libre para tratar la congestin y la secrecin nasal. El profesional podr recomendarle inhalantes para los otros sntomas. INSTRUCCIONES PARA EL CUIDADO DOMICILIARIO  Utilice los medicamentos de venta libre o de prescripcin para el dolor, el malestar o la fiebre, segn se lo indique el profesional que lo asiste.  Utilice un vaporizador caliente o inhale vapor, haciendo salir agua de la ducha para aumentar la humedad ambiente. Esto mantendr las secreciones hmedas y le resultar ms fcil respirar.  Beba gran cantidad de lquido para mantener la orina de tono claro o color amarillo plido.  Descanse todo lo que pueda.  Regrese a su trabajo cuando la temperatura se haya normalizado, o cuando el profesional que lo asiste se lo indique. Quizs sea necesario que permanezca en su casa durante un tiempo ms prolongado para evitar infectar a otras personas. Tambin puede utilizar un barbijo y ser cuidadoso con el lavado de manos para evitar la diseminacin del virus. SOLICITE ATENCIN MDICA SI:  Luego de los primeros das siente que empeora en vez de mejorar.  Necesita que el profesional le brinde ms informacin relacionada con los medicamentos para controlar los sntomas.  Siente escalofros, le falta el aire o escupe moco de color marrn o rojo. Estos pueden ser sntomas de neumona.  Tiene una secrecin nasal de color amarillo o marrn, o siente dolor en el rostro, especialmente cuando se inclina hacia adelante. Estos pueden ser sntomas de sinusitis.  Tiene fiebre, siente el cuello hinchado, tiene dolor al tragar u observa manchas blancas en el fondo de la garganta.  Estos pueden ser sntomas de angina por estreptococo. SOLICITE ATENCIN MDICA DE INMEDIATO SI:  Tiene fiebre.  Comienza a sentir un dolor de cabeza intenso o persistente, dolor de odos, en el seno nasal o en el pecho.  Tiene tos y esta se prolonga demasiado, tose y escupe sangre, la mucosidad habitual se modifica (si tiene una enfermedad pulmonar crnica) o respira con dificultad.  Siente rigidez en el cuello o dolor de cabeza intenso. Document Released: 10/21/2004 Document Revised: 04/05/2011 Exit[employer] Patient Information 2015 [employer]. This information is not intended to replace advice given to you by your health care provider. Make sure you discuss any questions you have with your health care provider.  

## 2014-04-26 NOTE — ED Provider Notes (Signed)
CSN: [id number]     Arrival date & time 04/26/14  2022 History   None    Chief Complaint  Patient presents with  . Cough   Patient is a 44 y.o. female presenting with cough. The history is provided by the patient. No language interpreter was used.  Cough Associated symptoms: fever and sore throat    This chart was scribed for non-physician practitioner Marlon Peliffany Micco Bourbeau, PA-C, working with Glynn OctaveStephen Rancour, MD, by Andrew Auaven Small, ED Scribe. This patient was seen in room TR10C/TR10C and the patient's care was started at 10:49 PM.  Shelby Mccann is a 44 y.o. female who presents to the Emergency Department complaining of a persistent cough.  Pt reports a constant cough with nasal congestion and sore throat for the past 3 days days. Pt has been unable to sleep due to cough and reports chest pain with cough. She has tried Claritin, Tramadol and nasal spray without relief to symptoms and tramadol which has helped with sleep. She was seen at urgent care 2 days ago and did not have chest xray done. Pt is concerned due to having  multiple infections, one in February and one in March. She states she has been taking left over amoxicillin and other medications which she reports improves her symptoms but notes symptoms return shortly after finishing dose of medication. She states that she's had blood work checked but the only abnormality was her elevated cholesterol.   Pt denies fever, chills, emesis and nausea, rash, fatigue. Weight loss, weight gain, lower extremity swelling, DOE, CP, SOB, back pain. Pt denies drug allergies.    History reviewed. No pertinent past medical history. History reviewed. No pertinent past surgical history. No family history on file. History  Substance Use Topics  . Smoking status: Never Smoker   . Smokeless tobacco: Not on file  . Alcohol Use: No   OB History    No data available     Review of Systems  Constitutional: Positive for fever.  HENT: Positive for congestion and  sore throat.   Respiratory: Positive for cough.   All other systems reviewed and are negative.  Allergies  Review of patient's allergies indicates no known allergies.  Home Medications   Prior to Admission medications   Medication Sig Start Date End Date Taking? Authorizing Provider  cetirizine (ZYRTEC) 10 MG tablet Take 1 tablet (10 mg total) by mouth daily. spanish 04/24/14  Yes Rodolph BongEvan S Corey, MD  fluticasone (FLONASE) 50 MCG/ACT nasal spray Place 2 sprays into both nostrils daily. spanish 04/24/14  Yes Rodolph BongEvan S Corey, MD  traMADol (ULTRAM) 50 MG tablet Take 1 tablet (50 mg total) by mouth at bedtime as needed (cough). spanish 04/24/14  Yes Rodolph BongEvan S Corey, MD  azithromycin (ZITHROMAX) 250 MG tablet Take 1 tablet (250 mg total) by mouth daily. 1 tab every day until finished. 04/26/14 04/30/14  Emmitte Surgeon Neva SeatGreene, PA-C  dextromethorphan-guaiFENesin (MUCINEX DM) 30-600 MG per 12 hr tablet Take 1 tablet by mouth 2 (two) times daily. 04/26/14   Naiyah Klostermann Neva SeatGreene, PA-C  ketotifen (ZADITOR) 0.025 % ophthalmic solution Place 1 drop into both eyes 2 (two) times daily. spanish Patient not taking: Reported on 04/26/2014 04/24/14   Rodolph BongEvan S Corey, MD   BP 112/72 mmHg  Pulse 100  Temp(Src) 98.1 F (36.7 C)  Resp 18  Wt 172 lb (78.019 kg)  SpO2 97% Physical Exam  Constitutional: She is oriented to person, place, and time. She appears well-developed and well-nourished. No distress.  HENT:  Head: Normocephalic and atraumatic.  Right Ear: Tympanic membrane and ear canal normal.  Left Ear: Tympanic membrane and ear canal normal.  Nose: Rhinorrhea and sinus tenderness present.  Mouth/Throat: Uvula is midline, oropharynx is clear and moist and mucous membranes are normal.  Eyes: Conjunctivae and EOM are normal.  Neck: Neck supple.  Cardiovascular: Normal rate.   Pulmonary/Chest: Effort normal and breath sounds normal. She has no decreased breath sounds. She has no wheezes. She has no rhonchi.  Bronchospasm during exam   Abdominal: Bowel sounds are normal. There is no tenderness.  Musculoskeletal: Normal range of motion.  Neurological: She is alert and oriented to person, place, and time.  Skin: Skin is warm and dry.  Psychiatric: She has a normal mood and affect. Her behavior is normal.  Nursing note and vitals reviewed.   ED Course  Procedures (including critical care time) DIAGNOSTIC STUDIES: Oxygen Saturation is 97% on RA, normal by my interpretation.    COORDINATION OF CARE: 10:49 PM- Pt advised of plan for treatment which includes abx and cough medication and pt agrees.  Labs Review Labs Reviewed - No data to display  Imaging Review Dg Chest 2 View  04/26/2014   CLINICAL DATA:  Cough for 2 weeks.  EXAM: CHEST  2 VIEW  COMPARISON:  None.  FINDINGS: The heart size and mediastinal contours are within normal limits. Low lung volumes noted however both lungs are clear. No evidence of pleural effusion or pneumothorax. The visualized skeletal structures are unremarkable.  IMPRESSION: Low lung volumes.  No active cardiopulmonary disease.   Electronically Signed   By: Myles Rosenthal M.D.   On: 04/26/2014 22:03     EKG Interpretation None      MDM   Final diagnoses:  Bronchitis   Pt is well appearing and non toxic. She reports having multiple infections this year so far but has not been prescribed any antibiotics. She reports continuing to cough and get worse. She reports having blood work a month ago and told she had high cholesterol but otherwise normal. Pt advised to follow-up with PCP and not to take left over medication anymore.  Medications  azithromycin (ZITHROMAX) tablet 500 mg (not administered)  benzonatate (TESSALON) capsule 100 mg (not administered)    44 y.o.Shelby Mccann's evaluation in the Emergency Department is complete. It has been determined that no acute conditions requiring further emergency intervention are present at this time. The patient/guardian have been advised of  the diagnosis and plan. We have discussed signs and symptoms that warrant return to the ED, such as changes or worsening in symptoms.  Vital signs are stable at discharge. Filed Vitals:   04/26/14 2251  BP: 111/72  Pulse: 87  Temp: 97.8 F (36.6 C)  Resp: 16    Patient/guardian has voiced understanding and agreed to follow-up with the PCP or specialist.  .scrone     Marlon Pel, PA-C 04/26/14 2255  Glynn Octave, MD 04/26/14 [phone]

## 2014-04-26 NOTE — ED Notes (Signed)
Cough for 2 weeks.  [facility] and  Was given meds but she is still having a cough

## 2014-05-07 ENCOUNTER — Encounter: Payer: Self-pay | Admitting: Internal Medicine

## 2014-05-07 ENCOUNTER — Ambulatory Visit: Payer: Self-pay | Attending: Internal Medicine | Admitting: Internal Medicine

## 2014-05-07 VITALS — BP 107/73 | HR 92 | Temp 98.0°F | Resp 16 | Wt 171.8 lb

## 2014-05-07 DIAGNOSIS — R059 Cough, unspecified: Secondary | ICD-10-CM

## 2014-05-07 DIAGNOSIS — Z8709 Personal history of other diseases of the respiratory system: Secondary | ICD-10-CM | POA: Insufficient documentation

## 2014-05-07 DIAGNOSIS — Z91048 Other nonmedicinal substance allergy status: Secondary | ICD-10-CM | POA: Insufficient documentation

## 2014-05-07 DIAGNOSIS — Z9109 Other allergy status, other than to drugs and biological substances: Secondary | ICD-10-CM

## 2014-05-07 DIAGNOSIS — Z139 Encounter for screening, unspecified: Secondary | ICD-10-CM | POA: Insufficient documentation

## 2014-05-07 DIAGNOSIS — R05 Cough: Secondary | ICD-10-CM | POA: Insufficient documentation

## 2014-05-07 LAB — COMPLETE METABOLIC PANEL WITH GFR
ALK PHOS: 89 U/L (ref 39–117)
ALT: 15 U/L (ref 0–35)
AST: 16 U/L (ref 0–37)
Albumin: 3.9 g/dL (ref 3.5–5.2)
BILIRUBIN TOTAL: 0.2 mg/dL (ref 0.2–1.2)
BUN: 14 mg/dL (ref 6–23)
CO2: 25 mEq/L (ref 19–32)
Calcium: 9.2 mg/dL (ref 8.4–10.5)
Chloride: 104 mEq/L (ref 96–112)
Creat: 0.66 mg/dL (ref 0.50–1.10)
GFR, Est African American: >89 mL/min
GLUCOSE: 106 mg/dL — AB (ref 70–99)
Potassium: 4.4 mEq/L (ref 3.5–5.3)
Sodium: 140 mEq/L (ref 135–145)
Total Protein: 6.8 g/dL (ref 6.0–8.3)

## 2014-05-07 LAB — HEMOGLOBIN A1C
Hgb A1c MFr Bld: 5.8 % — ABNORMAL HIGH (ref <5.7)
Mean Plasma Glucose: 120 mg/dL — ABNORMAL HIGH (ref <117)

## 2014-05-07 LAB — TSH: TSH: 1.114 u[IU]/mL (ref 0.350–4.500)

## 2014-05-07 MED ORDER — GUAIFENESIN-CODEINE 100-10 MG/5ML PO SOLN: 10.0000 mL | Freq: Four times a day (QID) | ORAL | Status: DC | PRN

## 2014-05-07 NOTE — Progress Notes (Signed)
Patient here for follow up from the ED Patient was diagnosed with bronchitis Patient is done with her medications and complains of still having A cough

## 2014-05-07 NOTE — Progress Notes (Signed)
MRN: [id number] Name: Shelby Mccann  Sex: female Age: 44 y.o. DOB: 1970-02-02  Allergies: Review of patient's allergies indicates no known allergies.  Chief Complaint  Patient presents with  . Follow-up    HPI: Patient is 44 y.o. female who Has history of environmental allergies, recently went to the emergency room with symptoms of coughing, EMR reviewed patient was diagnosed with bronchitis and was prescribed antibiotic which she already finished, currently denies any fever chills chest pain shortness of breath but has nasal congestion and intermittent cough patient has been using Mucinex, Flonase, Zyrtec, the cough is mainly nonproductive, her chest x-ray was negative for pneumonia.  No past medical history on file.  Past Surgical History  Procedure Laterality Date  . Abdominal hysterectomy        Medication List       This list is accurate as of: 05/07/14  5:48 PM.  Always use your most recent med list.               azithromycin 250 MG tablet  Commonly known as:  ZITHROMAX  Take 2 tabs by mouth today then 1 tab by mouth daily for 4 days     cetirizine 10 MG tablet  Commonly known as:  ZYRTEC  Take 1 tablet (10 mg total) by mouth daily.     cetirizine 10 MG tablet  Commonly known as:  ZYRTEC  Take 1 tablet (10 mg total) by mouth daily. spanish     cyclobenzaprine 10 MG tablet  Commonly known as:  FLEXERIL  Take 1 tablet (10 mg total) by mouth 2 (two) times daily as needed for muscle spasms.     dextromethorphan-guaiFENesin 30-600 MG per 12 hr tablet  Commonly known as:  MUCINEX DM  Take 1 tablet by mouth 2 (two) times daily.     fluconazole 150 MG tablet  Commonly known as:  DIFLUCAN  Take 1 tablet (150 mg total) by mouth once.     fluticasone 50 MCG/ACT nasal spray  Commonly known as:  FLONASE  Place 2 sprays into both nostrils daily.     fluticasone 50 MCG/ACT nasal spray  Commonly known as:  FLONASE  Place 2 sprays into both nostrils daily.  spanish     guaiFENesin-codeine 100-10 MG/5ML syrup  Take 10 mLs by mouth every 6 (six) hours as needed for cough.     ibuprofen 600 MG tablet  Commonly known as:  ADVIL,MOTRIN  Take 1 tablet (600 mg total) by mouth 2 (two) times daily as needed.     ketotifen 0.025 % ophthalmic solution  Commonly known as:  ZADITOR  Place 1 drop into both eyes 2 (two) times daily. spanish     naproxen sodium 220 MG tablet  Commonly known as:  ANAPROX  Take 440 mg by mouth 2 (two) times daily with a meal.     traMADol 50 MG tablet  Commonly known as:  ULTRAM  Take 1 tablet (50 mg total) by mouth at bedtime as needed (cough). spanish        Meds ordered this encounter  Medications  . guaiFENesin-codeine 100-10 MG/5ML syrup    Sig: Take 10 mLs by mouth every 6 (six) hours as needed for cough.    Dispense:  120 mL    Refill:  0     There is no immunization history on file for this patient.  Family History  Problem Relation Age of Onset  . Cancer Mother  utetrine cancer   . Cancer Brother     History  Substance Use Topics  . Smoking status: Never Smoker   . Smokeless tobacco: Not on file  . Alcohol Use: No    Review of Systems   As noted in HPI  Filed Vitals:   05/07/14 1706  BP: 107/73  Pulse: 92  Temp: 98 F (36.7 C)  Resp: 16    Physical Exam  Physical Exam  HENT:  Some nasal congestion no sinus tenderness, minimal pharyngeal erythema no exudate  Eyes: EOM are normal. Pupils are equal, round, and reactive to light.  Cardiovascular: Normal rate and regular rhythm.   Pulmonary/Chest: Breath sounds normal. No respiratory distress. She has no wheezes. She has no rales.    CBC    Component Value Date/Time   WBC 7.1 01/17/2013 1340   RBC 4.68 01/17/2013 1340   HGB 13.4 01/17/2013 1340   HCT 38.6 01/17/2013 1340   PLT 322 01/17/2013 1340   MCV 82.5 01/17/2013 1340   LYMPHSABS 2.1 01/17/2013 1340   MONOABS 0.5 01/17/2013 1340   EOSABS 0.1 01/17/2013 1340     BASOSABS 0.0 01/17/2013 1340    CMP     Component Value Date/Time   NA 140 01/17/2013 1340   K 4.2 01/17/2013 1340   CL 104 01/17/2013 1340   CO2 28 01/17/2013 1340   GLUCOSE 90 01/17/2013 1340   BUN 11 01/17/2013 1340   CREATININE 0.79 01/17/2013 1340   CREATININE 0.74 12/27/2009 1512   CALCIUM 9.4 01/17/2013 1340   PROT 7.1 01/17/2013 1340   ALBUMIN 4.1 01/17/2013 1340   AST 21 01/17/2013 1340   ALT 27 01/17/2013 1340   ALKPHOS 95 01/17/2013 1340   BILITOT 0.2* 01/17/2013 1340   GFRNONAA >89 01/17/2013 1340   GFRNONAA >60 12/27/2009 1512   GFRAA >89 01/17/2013 1340   GFRAA  12/27/2009 1512    >60        The eGFR has been calculated using the MDRD equation. This calculation has not been validated in all clinical situations. eGFR's persistently <60 mL/min signify possible Chronic Kidney Disease.    Lab Results  Component Value Date/Time   CHOL 179 01/07/2014 09:10 AM    No results found for: HGBA1C  Lab Results  Component Value Date/Time   AST 21 01/17/2013 01:40 PM    Assessment and Plan  Persistent Cough - Plan: have prescribed guaiFENesin-codeine 100-10 MG/5ML syrup  History of bronchitis - Plan: guaiFENesin-codeine 100-10 MG/5ML syrup  Environmental allergies Patient is taking Zyrtec, she will take Benadryl each bedtime  Screening - Plan: Ordered baseline blood work TSH, COMPLETE METABOLIC PANEL WITH GFR, Vit D  25 hydroxy (rtn osteoporosis monitoring), Hemoglobin A1c    No Follow-up on file.   This note has been created with Surveyor, quantity. Any transcriptional errors are unintentional.    Lorayne Marek, MD

## 2014-05-08 ENCOUNTER — Telehealth: Payer: Self-pay

## 2014-05-08 LAB — VITAMIN D 25 HYDROXY (VIT D DEFICIENCY, FRACTURES): Vit D, 25-Hydroxy: 26 ng/mL — ABNORMAL LOW (ref 30–100)

## 2014-05-08 MED ORDER — VITAMIN D (ERGOCALCIFEROL) 1.25 MG (50000 UNIT) PO CAPS: 50000.0000 [IU] | ORAL_CAPSULE | ORAL | Status: DC

## 2014-05-08 NOTE — Telephone Encounter (Signed)
-----   Message from Doris Cheadleeepak Advani, MD sent at 05/08/2014 11:51 AM EDT ----- Blood work reviewed, noticed low vitamin D, call patient advise to start ergocalciferol 50,000 units once a week for the duration of  12 weeks, then take OTC vitamin d 2000 units daily. noticed hemoglobin A1c of 5.8%, patient has prediabetes, call and advise patient for low carbohydrate diet.

## 2014-05-08 NOTE — Telephone Encounter (Signed)
Interpreter line used Oswaldo DoneHector [phone] [id number] Patient not available Unable to leave message-mail box is full

## 2014-07-04 ENCOUNTER — Ambulatory Visit: Payer: Self-pay | Attending: Internal Medicine | Admitting: Physician Assistant

## 2014-07-04 VITALS — BP 121/90 | HR 80 | Temp 98.4°F | Resp 16

## 2014-07-04 DIAGNOSIS — Z0000 Encounter for general adult medical examination without abnormal findings: Secondary | ICD-10-CM

## 2014-07-04 DIAGNOSIS — R8290 Unspecified abnormal findings in urine: Secondary | ICD-10-CM

## 2014-07-04 DIAGNOSIS — N898 Other specified noninflammatory disorders of vagina: Secondary | ICD-10-CM

## 2014-07-04 DIAGNOSIS — L298 Other pruritus: Secondary | ICD-10-CM

## 2014-07-04 LAB — POCT URINALYSIS DIPSTICK
Bilirubin, UA: NEGATIVE
Glucose, UA: NEGATIVE
Ketones, UA: NEGATIVE
LEUKOCYTES UA: NEGATIVE
Nitrite, UA: NEGATIVE
PH UA: 5.5
Protein, UA: NEGATIVE
Spec Grav, UA: <=1.005
UROBILINOGEN UA: 0.2

## 2014-07-04 MED ORDER — FLUCONAZOLE 150 MG PO TABS: 150.0000 mg | ORAL_TABLET | Freq: Once | ORAL | Status: DC

## 2014-07-04 NOTE — Addendum Note (Signed)
Addended byScot Jun S on: 07/04/2014 12:05 PM   Modules accepted: Orders

## 2014-07-04 NOTE — Addendum Note (Signed)
Addended byScot Jun S on: 07/04/2014 02:06 PM   Modules accepted: Orders

## 2014-07-04 NOTE — Progress Notes (Signed)
C/o vaginal itching States she has little white bumps in private area States bumps have white head on them  Denies any discharge   States she has not got a call about her mammogram appt

## 2014-07-04 NOTE — Addendum Note (Signed)
Addended by: Lestine Mount on: 07/04/2014 03:53 PM   Modules accepted: Orders

## 2014-07-04 NOTE — Progress Notes (Addendum)
Chief Complaint: Vaginal itch  Subjective:  this is a 44 year old Hispanic female presenting with vaginal irritation. Her symptoms started on Sunday. She describes an itchy burning sensation vaginally. She has minimal discharge and it is clear most of the time. She's not had sex in 2 weeks. She no longer menstruates.  ROS:  GEN: denies fever or chills, denies change in weight Skin: denies lesions or rashes GU: positive itchy/burning discharge ; no blood    Objective:  Filed Vitals:   07/04/14 1127  BP: 121/90  Pulse: 80  Temp: 98.4 F (36.9 C)  TempSrc: Oral  Resp: 16  SpO2: 96%    Physical Exam:  General: in no acute distress. Abdomen: Soft, nontender, nondistended, positive bowel sounds. GU:  Thick clear to white discharge, purulent, no evidence of blood. No uterus.  Pertinent Lab Results: urine dipstick essentially normal; wet prep pending   Medications: Prior to Admission medications   Medication Sig Start Date End Date Taking? Authorizing Provider  azithromycin (ZITHROMAX) 250 MG tablet Take 2 tabs by mouth today then 1 tab by mouth daily for 4 days 01/14/14   Doris Cheadle, MD  cetirizine (ZYRTEC) 10 MG tablet Take 1 tablet (10 mg total) by mouth daily. Patient not taking: Reported on 01/14/2014 05/30/13   Doris Cheadle, MD  cetirizine (ZYRTEC) 10 MG tablet Take 1 tablet (10 mg total) by mouth daily. spanish 04/24/14   Rodolph Bong, MD  cyclobenzaprine (FLEXERIL) 10 MG tablet Take 1 tablet (10 mg total) by mouth 2 (two) times daily as needed for muscle spasms. 01/09/14   Doris Cheadle, MD  dextromethorphan-guaiFENesin (MUCINEX DM) 30-600 MG per 12 hr tablet Take 1 tablet by mouth 2 (two) times daily. 04/26/14   Satori Krabill Neva Seat, PA-C  fluconazole (DIFLUCAN) 150 MG tablet Take 1 tablet (150 mg total) by mouth once. 07/04/14   Joesphine Schemm Netta Cedars, PA-C  fluticasone (FLONASE) 50 MCG/ACT nasal spray Place 2 sprays into both nostrils daily. 01/14/14   Doris Cheadle, MD  fluticasone  (FLONASE) 50 MCG/ACT nasal spray Place 2 sprays into both nostrils daily. spanish 04/24/14   Rodolph Bong, MD  guaiFENesin-codeine 100-10 MG/5ML syrup Take 10 mLs by mouth every 6 (six) hours as needed for cough. 05/07/14   Doris Cheadle, MD  ibuprofen (ADVIL,MOTRIN) 600 MG tablet Take 1 tablet (600 mg total) by mouth 2 (two) times daily as needed. 01/09/14   Doris Cheadle, MD  ketotifen (ZADITOR) 0.025 % ophthalmic solution Place 1 drop into both eyes 2 (two) times daily. spanish Patient not taking: Reported on 04/26/2014 04/24/14   Rodolph Bong, MD  naproxen sodium (ANAPROX) 220 MG tablet Take 440 mg by mouth 2 (two) times daily with a meal.    Historical Provider, MD  traMADol (ULTRAM) 50 MG tablet Take 1 tablet (50 mg total) by mouth at bedtime as needed (cough). spanish 04/24/14   Rodolph Bong, MD  Vitamin D, Ergocalciferol, (DRISDOL) 50000 UNITS CAPS capsule Take 1 capsule (50,000 Units total) by mouth every 7 (seven) days. 05/08/14   Doris Cheadle, MD    Assessment: 1. Vaginal Discharge/Infection, likely yeast  Plan: Diflucan 150 mg  Follow up:as scheduled  The patient was given clear instructions to go to ER or return to medical center if symptoms don't improve, worsen or new problems develop. The patient verbalized understanding. The patient was told to call to get lab results if they haven't heard anything in the next week.   This note has been created with [employer]  Airline pilot. Any transcriptional errors are unintentional.   Scot Jun, PA-C 07/04/2014, 11:51 AM

## 2014-07-05 LAB — CERVICOVAGINAL ANCILLARY ONLY: WET PREP (BD AFFIRM): POSITIVE — AB

## 2014-07-16 ENCOUNTER — Telehealth: Payer: Self-pay

## 2014-07-16 NOTE — Telephone Encounter (Signed)
Nurse called patient via in house interpreter, [place]. Message left with husband for patient to return call to [facility] at [phone].

## 2014-07-16 NOTE — Telephone Encounter (Signed)
-----   Message from Vivianne Master, [place] sent at 07/08/2014  3:57 PM EDT ----- Will you or Belen check on this lady? She had a discharge last week and was empirically treated for yeast. Let her know the swab confirmed yeast and ask if she is better. Thanks.  ----- Message -----    From: [place] N Shannon, CMA    Sent: 07/04/2014  11:53 AM      To: Vivianne Master, PA-C

## 2014-07-19 NOTE — Telephone Encounter (Signed)
Patient came into office, states she is feeling better now.

## 2014-07-26 ENCOUNTER — Emergency Department (HOSPITAL_COMMUNITY): Payer: Self-pay

## 2014-07-26 ENCOUNTER — Emergency Department (HOSPITAL_COMMUNITY)
Admission: EM | Admit: 2014-07-26 | Discharge: 2014-07-26 | Disposition: A | Discharge status: Home / Self Care | Payer: Self-pay | Attending: Emergency Medicine | Admitting: Emergency Medicine

## 2014-07-26 ENCOUNTER — Encounter (HOSPITAL_COMMUNITY): Payer: Self-pay | Admitting: Emergency Medicine

## 2014-07-26 DIAGNOSIS — Z7951 Long term (current) use of inhaled steroids: Secondary | ICD-10-CM | POA: Insufficient documentation

## 2014-07-26 DIAGNOSIS — Z791 Long term (current) use of non-steroidal anti-inflammatories (NSAID): Secondary | ICD-10-CM | POA: Insufficient documentation

## 2014-07-26 DIAGNOSIS — R002 Palpitations: Secondary | ICD-10-CM | POA: Insufficient documentation

## 2014-07-26 DIAGNOSIS — R0789 Other chest pain: Secondary | ICD-10-CM | POA: Insufficient documentation

## 2014-07-26 DIAGNOSIS — Z79899 Other long term (current) drug therapy: Secondary | ICD-10-CM | POA: Insufficient documentation

## 2014-07-26 DIAGNOSIS — R079 Chest pain, unspecified: Secondary | ICD-10-CM

## 2014-07-26 LAB — CBC WITH DIFFERENTIAL/PLATELET
BASOS ABS: 0 10*3/uL (ref 0.0–0.1)
Basophils Relative: 1 % (ref 0–1)
EOS PCT: 2 % (ref 0–5)
Eosinophils Absolute: 0.1 10*3/uL (ref 0.0–0.7)
HCT: 40.1 % (ref 36.0–46.0)
Hemoglobin: 13.6 g/dL (ref 12.0–15.0)
LYMPHS ABS: 3 10*3/uL (ref 0.7–4.0)
Lymphocytes Relative: 36 % (ref 12–46)
MCH: 28.3 pg (ref 26.0–34.0)
MCHC: 33.9 g/dL (ref 30.0–36.0)
MCV: 83.5 fL (ref 78.0–100.0)
Monocytes Absolute: 0.4 10*3/uL (ref 0.1–1.0)
Monocytes Relative: 5 % (ref 3–12)
NEUTROS PCT: 56 % (ref 43–77)
Neutro Abs: 4.7 10*3/uL (ref 1.7–7.7)
Platelets: 293 10*3/uL (ref 150–400)
RBC: 4.8 MIL/uL (ref 3.87–5.11)
RDW: 13.6 % (ref 11.5–15.5)
WBC: 8.3 10*3/uL (ref 4.0–10.5)

## 2014-07-26 LAB — I-STAT TROPONIN, ED: Troponin i, poc: 0 ng/mL (ref 0.00–0.08)

## 2014-07-26 LAB — BRAIN NATRIURETIC PEPTIDE: B Natriuretic Peptide: 9.6 pg/mL (ref 0.0–100.0)

## 2014-07-26 LAB — BASIC METABOLIC PANEL
ANION GAP: 9 (ref 5–15)
BUN: 11 mg/dL (ref 6–20)
CHLORIDE: 105 mmol/L (ref 101–111)
CO2: 26 mmol/L (ref 22–32)
Calcium: 9.4 mg/dL (ref 8.9–10.3)
Creatinine, Ser: 0.84 mg/dL (ref 0.44–1.00)
GFR calc Af Amer: >60 mL/min (ref >60)
GFR calc non Af Amer: >60 mL/min (ref >60)
Glucose, Bld: 118 mg/dL — ABNORMAL HIGH (ref 65–99)
Potassium: 3.8 mmol/L (ref 3.5–5.1)
Sodium: 140 mmol/L (ref 135–145)

## 2014-07-26 MED ORDER — GI COCKTAIL ~~LOC~~
30.0000 mL | Freq: Once | ORAL | Status: AC
  Administered 2014-07-26: 30 mL via ORAL
  Filled 2014-07-26: qty 30

## 2014-07-26 MED ORDER — OMEPRAZOLE 20 MG PO CPDR: 20.0000 mg | DELAYED_RELEASE_CAPSULE | Freq: Every day | ORAL | Status: DC

## 2014-07-26 NOTE — ED Notes (Signed)
Pt. reports intermittent left chest pain onset this week with mild SOB , denies emesis or diaphoresis . No cough or congestion .

## 2014-07-26 NOTE — ED Provider Notes (Signed)
CSN: [id number]     Arrival date & time 07/26/14  2100 History   First MD Initiated Contact with Patient 07/26/14 2139     Chief Complaint  Patient presents with  . Chest Pain     (Consider location/radiation/quality/duration/timing/severity/associated sxs/prior Treatment) Patient is a 44 y.o. female presenting with chest pain. The history is provided by the patient. 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  Chest Pain Pain location:  Substernal area Pain quality: aching   Pain radiates to:  Does not radiate Pain radiates to the back: no   Pain severity:  Mild Onset quality:  Gradual Duration:  3 days Associated symptoms: palpitations   Associated symptoms: no cough, no fever and no shortness of breath   Associated symptoms comment:  Via the phone interpreter, patient without other medical history presents with 3 days of non-radiating chest discomfort in central chest. No aggravating or alleviating factors. She feels intermittent palpitations. She has been taking aspirin and this helps her symptoms but they recur. No cough or fever. No nausea or vomiting. She reports today the pain was worse with eating, enough to make her stop eating her meal. Minimal discomfort currently. She denies family history of heart disease.   History reviewed. No pertinent past medical history. Past Surgical History  Procedure Laterality Date  . Abdominal hysterectomy    . Cesarean section     Family History  Problem Relation Age of Onset  . Cancer Mother     utetrine cancer   . Cancer Brother    History  Substance Use Topics  . Smoking status: Never Smoker   . Smokeless tobacco: Not on file  . Alcohol Use: No   OB History    Gravida Para Term Preterm AB TAB SAB Ectopic Multiple Living   0 0 0 0 0 0 0 0       Review of Systems  Constitutional: Negative for fever and chills.  Respiratory: Negative.  Negative for cough and shortness of breath.   Cardiovascular:  Positive for chest pain and palpitations.  Gastrointestinal: Negative.   Musculoskeletal: Negative.   Skin: Negative.   Neurological: Negative.       Allergies  Review of patient's allergies indicates no known allergies.  Home Medications   Prior to Admission medications   Medication Sig Start Date End Date Taking? Authorizing Provider  azithromycin (ZITHROMAX) 250 MG tablet Take 2 tabs by mouth today then 1 tab by mouth daily for 4 days 01/14/14   Doris Cheadleeepak Advani, MD  cetirizine (ZYRTEC) 10 MG tablet Take 1 tablet (10 mg total) by mouth daily. Patient not taking: Reported on 01/14/2014 05/30/13   Doris Cheadleeepak Advani, MD  cetirizine (ZYRTEC) 10 MG tablet Take 1 tablet (10 mg total) by mouth daily. spanish 04/24/14   Rodolph BongEvan S Corey, MD  cyclobenzaprine (FLEXERIL) 10 MG tablet Take 1 tablet (10 mg total) by mouth 2 (two) times daily as needed for muscle spasms. 01/09/14   Doris Cheadleeepak Advani, MD  dextromethorphan-guaiFENesin (MUCINEX DM) 30-600 MG per 12 hr tablet Take 1 tablet by mouth 2 (two) times daily. 04/26/14   Tiffany Neva SeatGreene, PA-C  fluconazole (DIFLUCAN) 150 MG tablet Take 1 tablet (150 mg total) by mouth once. 07/04/14   Tiffany Netta CedarsS Noel, PA-C  fluticasone (FLONASE) 50 MCG/ACT nasal spray Place 2 sprays into both nostrils daily. 01/14/14   Doris Cheadleeepak Advani, MD  fluticasone (FLONASE) 50 MCG/ACT nasal spray Place 2 sprays into both nostrils daily. spanish 04/24/14   Rodolph BongEvan S Corey,  MD  guaiFENesin-codeine 100-10 MG/5ML syrup Take 10 mLs by mouth every 6 (six) hours as needed for cough. 05/07/14   Doris Cheadle, MD  ibuprofen (ADVIL,MOTRIN) 600 MG tablet Take 1 tablet (600 mg total) by mouth 2 (two) times daily as needed. 01/09/14   Doris Cheadle, MD  ketotifen (ZADITOR) 0.025 % ophthalmic solution Place 1 drop into both eyes 2 (two) times daily. spanish Patient not taking: Reported on 04/26/2014 04/24/14   Rodolph Bong, MD  naproxen sodium (ANAPROX) 220 MG tablet Take 440 mg by mouth 2 (two) times daily with a meal.     Historical Provider, MD  traMADol (ULTRAM) 50 MG tablet Take 1 tablet (50 mg total) by mouth at bedtime as needed (cough). spanish 04/24/14   Rodolph Bong, MD  Vitamin D, Ergocalciferol, (DRISDOL) 50000 UNITS CAPS capsule Take 1 capsule (50,000 Units total) by mouth every 7 (seven) days. 05/08/14   Doris Cheadle, MD   BP 118/62 mmHg  Pulse 93  Temp(Src) 97.9 F (36.6 C) (Oral)  Resp 16  SpO2 98% Physical Exam  Constitutional: She is oriented to person, place, and time. She appears well-developed and well-nourished.  HENT:  Head: Normocephalic.  Neck: Normal range of motion. Neck supple.  Cardiovascular: Normal rate and regular rhythm.   Pulmonary/Chest: Effort normal and breath sounds normal. She has no wheezes. She has no rales. She exhibits no tenderness.  Abdominal: Soft. Bowel sounds are normal. There is no tenderness. There is no rebound and no guarding.  Musculoskeletal: Normal range of motion. She exhibits no edema.  Neurological: She is alert and oriented to person, place, and time.  Skin: Skin is warm and dry. No rash noted.  Psychiatric: She has a normal mood and affect.    ED Course  Procedures (including critical care time) Labs Review Labs Reviewed  CBC WITH DIFFERENTIAL/PLATELET  BASIC METABOLIC PANEL  BRAIN NATRIURETIC PEPTIDE  I-STAT TROPOININ, ED    Imaging Review Dg Chest 2 View  07/26/2014   CLINICAL DATA:  Chest pain, shortness of breath today.  EXAM: CHEST  2 VIEW  COMPARISON:  Jun 17, 2009  FINDINGS: The heart size and mediastinal contours are within normal limits. There is no focal infiltrate, pulmonary edema, or pleural effusion. The visualized skeletal structures are unremarkable.  IMPRESSION: No active cardiopulmonary disease.   Electronically Signed   By: Sherian Rein M.D.   On: 07/26/2014 21:39     EKG Interpretation None      MDM   Final diagnoses:  None    1. Chest pain  Patient with no risk factors for cardiac disease presents with  atypical chest pain, currently without symptoms. Labs pending. GI cocktail ordered after eliciting history that pain was worse with meal today. Will observe and re-evaluate.  Pain is resolved with GI Cocktail. Labs and imaging are negative. EKG non-acute. Doubt ACS, PE. Will discharge home with prilosec and encourage PCP follow up for recheck in 1-2 weeks.   Elpidio Anis, PA-C 07/26/14 1610  Jerelyn Scott, MD 07/26/14 [phone]

## 2014-07-26 NOTE — ED Notes (Addendum)
Discharge instructions reviewed by Spanish Int.  Patient voiced understanding.

## 2014-07-26 NOTE — Discharge Instructions (Signed)
Dolor de pecho (no específico) °(Chest Pain (Nonspecific)) °Con frecuencia es difícil dar un diagnóstico específico de la causa del dolor de pecho. Siempre hay una posibilidad de que el dolor podría estar relacionado con algo grave, como un ataque al corazón o un coágulo sanguíneo en los pulmones. Debe someterse a controles con el médico para más evaluaciones. °CAUSAS  °· Acidez. °· Neumonía o bronquitis. °· Ansiedad o estrés. °· Inflamación de la zona que rodea al corazón (pericarditis) o a los pulmones (pleuritis o pleuresía). °· Un coágulo sanguíneo en el pulmón. °· Colapso de un pulmón (neumotórax), que puede aparecer de manera repentina por sí solo (neumotórax espontáneo) o debido a un traumatismo en el tórax. °· Culebrilla (virus del herpes zóster). °La pared torácica está compuesta por huesos, músculos y cartílago. Cualquiera de estos puede ser la fuente del dolor. °· Puede haber una contusión en los huesos debido a una lesión. °· Puede haber un esguince en los músculos o el cartílago ocasionado por la tos o por trabajo excesivo. °· El cartílago puede verse afectado por una inflamación y provocar dolor (costocondritis). °DIAGNÓSTICO  °Quizás se necesiten análisis de laboratorio u otros estudios para encontrar la causa del dolor. Además, puede indicarle que se haga una prueba llamada electrocadiograma (ECG) ambulatorio. El ECG registra los patrones de los latidos cardíacos durante 24 horas. Además, pueden hacerle otros estudios, por ejemplo: °· Ecocardiograma transtorácico (ETT). Durante el ecocardiograma, se usan ondas sonoras para evaluar el flujo de la sangre a través del corazón. °· Ecocardiograma transesofágico (ETE). °· Monitoreo cardíaco. Permite que el médico controle la frecuencia y el ritmo cardíaco en tiempo real. °· Monitor Holter. Es un dispositivo portátil que registra los latidos cardíacos y ayuda a diagnosticar las arritmias cardíacas. Le permite al médico registrar la actividad cardíaca  durante varios días, si es necesario. °· Pruebas de estrés por ejercicio o por medicamentos que aceleran los latidos cardíacos. °TRATAMIENTO  °· El tratamiento depende de la causa del dolor de pecho. El tratamiento puede incluir: °¨ Inhibidores de la acidez estomacal. °¨ Antiinflamatorios. °¨ Analgésicos para las enfermedades inflamatorias. °¨ Antibióticos, si hay una infección. °· Podrán aconsejarle que modifique su estilo de vida. Esto incluye dejar de fumar y evitar el alcohol, la cafeína y el chocolate. °· Pueden aconsejarle que mantenga la cabeza levantada (elevada) cuando duerme. Esto reduce la probabilidad de que el ácido retroceda del estómago al esófago. °En la mayoría de los casos, el dolor de pecho no específico mejorará en el término de 2 a 3 días, con reposo y analgésicos suaves.  °INSTRUCCIONES PARA EL CUIDADO EN EL HOGAR  °· Si le prescriben antibióticos, tómelos tal como se le indicó. Termínelos aunque comience a sentirse mejor. °· Durante los días siguientes, no haga actividades físicas que provoquen dolor de pecho. Continúe con las actividades físicas tal como se le indicó °· No consuma ningún producto que contenga tabaco, incluidos cigarrillos, tabaco de mascar o cigarrillos electrónicos. °· Evite el consumo de alcohol. °· Tome los medicamentos solamente como se lo haya indicado el médico. °· Siga las sugerencias del médico en lo que respecta a las pruebas adicionales, si el dolor de pecho no desaparece. °· Concurra a todas las visitas de control programadas. Si no lo hace, podría desarrollar problemas permanentes (crónicos) relacionados con el dolor. Si hay algún problema para concurrir a una cita, llame para reprogramarla. °SOLICITE ATENCIÓN MÉDICA SI:  °· El dolor de pecho no desaparece, incluso después del tratamiento. °· Tiene una erupción cutánea con ampollas en el   pecho. °· Tiene fiebre. °SOLICITE ATENCIÓN MÉDICA DE INMEDIATO SI:  °· Aumenta el dolor de pecho o este se irradia hacia el  brazo, el cuello, la mandíbula, la espalda o el abdomen. °· Le falta el aire. °· La tos empeora, o expectora sangre. °· Siente dolor intenso en la espalda o el abdomen. °· Se siente nauseoso o vomita. °· Siente debilidad intensa. °· Se desmaya. °· Tiene escalofríos. °Esto es una emergencia. No espere a ver si el dolor se pasa. Obtenga ayuda médica de inmediato. Llame a los servicios de emergencia locales (911 en los [place]). No conduzca por sus propios medios hasta el hospital. °ASEGÚRESE DE QUE:  °· Comprende estas instrucciones. °· Controlará su afección. °· Recibirá ayuda de inmediato si no mejora o si empeora. °Document Released: 01/11/2005 Document Revised: 01/16/2013 °ExitCare® Patient Information ©2015 [employer]. This information is not intended to replace advice given to you by your health care provider. Make sure you discuss any questions you have with your health care provider. ° °

## 2014-08-01 ENCOUNTER — Encounter: Payer: Self-pay | Admitting: Internal Medicine

## 2014-08-01 ENCOUNTER — Ambulatory Visit: Payer: Self-pay | Attending: Internal Medicine

## 2014-08-01 ENCOUNTER — Other Ambulatory Visit: Payer: Self-pay

## 2014-08-01 ENCOUNTER — Ambulatory Visit (HOSPITAL_BASED_OUTPATIENT_CLINIC_OR_DEPARTMENT_OTHER): Payer: Self-pay | Admitting: Internal Medicine

## 2014-08-01 VITALS — BP 107/74 | HR 87 | Temp 98.0°F | Resp 18 | Ht 59.0 in | Wt 170.4 lb

## 2014-08-01 DIAGNOSIS — Z7982 Long term (current) use of aspirin: Secondary | ICD-10-CM | POA: Insufficient documentation

## 2014-08-01 DIAGNOSIS — R1011 Right upper quadrant pain: Secondary | ICD-10-CM

## 2014-08-01 DIAGNOSIS — Z79891 Long term (current) use of opiate analgesic: Secondary | ICD-10-CM | POA: Insufficient documentation

## 2014-08-01 DIAGNOSIS — Z79899 Other long term (current) drug therapy: Secondary | ICD-10-CM | POA: Insufficient documentation

## 2014-08-01 DIAGNOSIS — R079 Chest pain, unspecified: Secondary | ICD-10-CM | POA: Insufficient documentation

## 2014-08-01 DIAGNOSIS — Z808 Family history of malignant neoplasm of other organs or systems: Secondary | ICD-10-CM | POA: Insufficient documentation

## 2014-08-01 DIAGNOSIS — Z90710 Acquired absence of both cervix and uterus: Secondary | ICD-10-CM | POA: Insufficient documentation

## 2014-08-01 DIAGNOSIS — R0789 Other chest pain: Secondary | ICD-10-CM

## 2014-08-01 NOTE — Progress Notes (Signed)
Patient here for hospital follow up for chest discomfort.  Patient states they gave her Omeprazole but it is not helping.  Patient is unsure if pain is coming from chest or stomach.  Patient states drinking cold water helps relieve discomfort.  Patient reports a little shortness of breath/pressure/throbbing.  Patient denies worsening symptoms at night, lying down, or after meals.

## 2014-08-01 NOTE — Progress Notes (Signed)
MRN: [id number] Name: Shelby Mccann  Sex: female Age: 44 y.o. DOB: Oct 02, 1970  Allergies: Review of patient's allergies indicates no known allergies.  Chief Complaint  Patient presents with  . Hospitalization Follow-up  . Chest Pain    HPI: Patient is 44 y.o. female who went to the emergency room one week ago with symptoms of chest pain/pressure, EMR reviewed, blood work done troponin negative EKG showed normal sinus rhythm questionable previous ST-T changes, patient was given GI cocktail and was started on Prilosec, she still has some baseline symptoms today repeat EKG shows normal sinus rhythm with borderline ST-T changes, patient typically described the pain is in her upper abdomen epigastric area nonradiating denies any nausea vomiting fever chills any shortness of breath patient does not have any other risk factors of hypertension diabetes hyperlipidemia patient does not smoke cigarettes.patient denies any orthopnea PND or exertional chest pain. Patient denies any stress, did complain of palpitation, her last TSH level was normal.  No past medical history on file.  Past Surgical History  Procedure Laterality Date  . Abdominal hysterectomy    . Cesarean section        Medication List       This list is accurate as of: 08/01/14  1:12 PM.  Always use your most recent med list.               aspirin EC 81 MG tablet  Take 81 mg by mouth daily.     azithromycin 250 MG tablet  Commonly known as:  ZITHROMAX  Take 2 tabs by mouth today then 1 tab by mouth daily for 4 days     cetirizine 10 MG tablet  Commonly known as:  ZYRTEC  Take 1 tablet (10 mg total) by mouth daily.     cetirizine 10 MG tablet  Commonly known as:  ZYRTEC  Take 1 tablet (10 mg total) by mouth daily. spanish     cyclobenzaprine 10 MG tablet  Commonly known as:  FLEXERIL  Take 1 tablet (10 mg total) by mouth 2 (two) times daily as needed for muscle spasms.     dextromethorphan-guaiFENesin  30-600 MG per 12 hr tablet  Commonly known as:  MUCINEX DM  Take 1 tablet by mouth 2 (two) times daily.     fluconazole 150 MG tablet  Commonly known as:  DIFLUCAN  Take 1 tablet (150 mg total) by mouth once.     fluticasone 50 MCG/ACT nasal spray  Commonly known as:  FLONASE  Place 2 sprays into both nostrils daily.     fluticasone 50 MCG/ACT nasal spray  Commonly known as:  FLONASE  Place 2 sprays into both nostrils daily. spanish     guaiFENesin-codeine 100-10 MG/5ML syrup  Take 10 mLs by mouth every 6 (six) hours as needed for cough.     ibuprofen 600 MG tablet  Commonly known as:  ADVIL,MOTRIN  Take 1 tablet (600 mg total) by mouth 2 (two) times daily as needed.     ketotifen 0.025 % ophthalmic solution  Commonly known as:  ZADITOR  Place 1 drop into both eyes 2 (two) times daily. spanish     omeprazole 20 MG capsule  Commonly known as:  PRILOSEC  Take 1 capsule (20 mg total) by mouth daily.     traMADol 50 MG tablet  Commonly known as:  ULTRAM  Take 1 tablet (50 mg total) by mouth at bedtime as needed (cough). spanish  Vitamin D (Ergocalciferol) 50000 UNITS Caps capsule  Commonly known as:  DRISDOL  Take 1 capsule (50,000 Units total) by mouth every 7 (seven) days.        No orders of the defined types were placed in this encounter.     There is no immunization history on file for this patient.  Family History  Problem Relation Age of Onset  . Cancer Mother     utetrine cancer   . Cancer Brother     History  Substance Use Topics  . Smoking status: Never Smoker   . Smokeless tobacco: Not on file  . Alcohol Use: No    Review of Systems   As noted in HPI  Filed Vitals:   08/01/14 1141  BP: 107/74  Pulse: 87  Temp: 98 F (36.7 C)  Resp: 18    Physical Exam  Physical Exam  Constitutional: No distress.  Eyes: EOM are normal. Pupils are equal, round, and reactive to light.  Cardiovascular: Normal rate and regular rhythm.     Pulmonary/Chest: Breath sounds normal. No respiratory distress. She has no wheezes. She has no rales.  Abdominal: There is no rebound and no guarding.  Minimal epigastric/right upper quadrant tenderness, Murphy's negative    CBC    Component Value Date/Time   WBC 8.3 07/26/2014 2112   RBC 4.80 07/26/2014 2112   HGB 13.6 07/26/2014 2112   HCT 40.1 07/26/2014 2112   PLT 293 07/26/2014 2112   MCV 83.5 07/26/2014 2112   LYMPHSABS 3.0 07/26/2014 2112   MONOABS 0.4 07/26/2014 2112   EOSABS 0.1 07/26/2014 2112   BASOSABS 0.0 07/26/2014 2112    CMP     Component Value Date/Time   NA 140 07/26/2014 2112   K 3.8 07/26/2014 2112   CL 105 07/26/2014 2112   CO2 26 07/26/2014 2112   GLUCOSE 118* 07/26/2014 2112   BUN 11 07/26/2014 2112   CREATININE 0.84 07/26/2014 2112   CREATININE 0.66 05/07/2014 1744   CALCIUM 9.4 07/26/2014 2112   PROT 6.8 05/07/2014 1744   ALBUMIN 3.9 05/07/2014 1744   AST 16 05/07/2014 1744   ALT 15 05/07/2014 1744   ALKPHOS 89 05/07/2014 1744   BILITOT 0.2 05/07/2014 1744   GFRNONAA >60 07/26/2014 2112   GFRNONAA >89 05/07/2014 1744   GFRAA >60 07/26/2014 2112   GFRAA >89 05/07/2014 1744    Lab Results  Component Value Date/Time   CHOL 179 01/07/2014 09:10 AM    Lab Results  Component Value Date/Time   HGBA1C 5.8* 05/07/2014 05:44 PM    Lab Results  Component Value Date/Time   AST 16 05/07/2014 05:44 PM    Assessment and Plan  RUQ pain - Plan: ordered US Abdomen Complete  Atypical chest pain likely GI related, she's advised for lifestyle modification, continue with Prilosec.   Return in about 3 months (around 11/01/2014), or if symptoms worsen or fail to improve.   This note has been created with Education officer, [employer]Dragon speech recognition software and smart phrase technology. Any transcriptional errors are unintentional.    Doris CheadleADVANI, Hennessy Bartel, MD

## 2014-08-09 ENCOUNTER — Ambulatory Visit (HOSPITAL_COMMUNITY)
Admission: RE | Admit: 2014-08-09 | Discharge: 2014-08-09 | Disposition: A | Discharge status: Home / Self Care | Payer: Self-pay | Source: Ambulatory Visit | Attending: Internal Medicine | Admitting: Internal Medicine

## 2014-08-09 DIAGNOSIS — R1013 Epigastric pain: Secondary | ICD-10-CM | POA: Insufficient documentation

## 2014-08-09 DIAGNOSIS — R1011 Right upper quadrant pain: Secondary | ICD-10-CM | POA: Insufficient documentation

## 2014-08-13 ENCOUNTER — Telehealth: Payer: Self-pay

## 2014-08-13 NOTE — Telephone Encounter (Signed)
In house interpreter used Patient not available Unable to leave message voice mail is full

## 2014-08-13 NOTE — Telephone Encounter (Signed)
-----   Message from Doris Cheadleeepak Advani, MD sent at 08/12/2014 12:36 PM EDT ----- Call and let the patient know that her ultrasound abdomen is negative for gallstones.  FINDINGS: Gallbladder: No gallstones or wall thickening visualized. There is no pericholecystic fluid. No sonographic Murphy sign noted.

## 2014-09-04 ENCOUNTER — Ambulatory Visit: Payer: Self-pay | Admitting: Internal Medicine

## 2014-09-16 ENCOUNTER — Other Ambulatory Visit: Payer: Self-pay

## 2014-09-16 DIAGNOSIS — Z0000 Encounter for general adult medical examination without abnormal findings: Secondary | ICD-10-CM

## 2014-10-10 ENCOUNTER — Ambulatory Visit: Payer: Self-pay | Admitting: Family Medicine

## 2014-10-31 ENCOUNTER — Ambulatory Visit: Payer: Self-pay | Attending: Internal Medicine | Admitting: Family Medicine

## 2014-10-31 ENCOUNTER — Other Ambulatory Visit: Payer: Self-pay | Admitting: Family Medicine

## 2014-10-31 ENCOUNTER — Encounter: Payer: Self-pay | Admitting: Family Medicine

## 2014-10-31 VITALS — BP 105/66 | HR 67 | Temp 98.7°F | Resp 16 | Ht 59.0 in | Wt 173.0 lb

## 2014-10-31 DIAGNOSIS — R05 Cough: Secondary | ICD-10-CM | POA: Insufficient documentation

## 2014-10-31 DIAGNOSIS — Z0000 Encounter for general adult medical examination without abnormal findings: Secondary | ICD-10-CM | POA: Insufficient documentation

## 2014-10-31 DIAGNOSIS — Z9109 Other allergy status, other than to drugs and biological substances: Secondary | ICD-10-CM

## 2014-10-31 DIAGNOSIS — Z7982 Long term (current) use of aspirin: Secondary | ICD-10-CM | POA: Insufficient documentation

## 2014-10-31 DIAGNOSIS — Z1231 Encounter for screening mammogram for malignant neoplasm of breast: Secondary | ICD-10-CM

## 2014-10-31 DIAGNOSIS — Z91048 Other nonmedicinal substance allergy status: Secondary | ICD-10-CM

## 2014-10-31 DIAGNOSIS — Z79899 Other long term (current) drug therapy: Secondary | ICD-10-CM | POA: Insufficient documentation

## 2014-10-31 DIAGNOSIS — J301 Allergic rhinitis due to pollen: Secondary | ICD-10-CM | POA: Insufficient documentation

## 2014-10-31 MED ORDER — CETIRIZINE HCL 10 MG PO TABS: 10.0000 mg | ORAL_TABLET | Freq: Every day | ORAL | Status: DC

## 2014-10-31 NOTE — Progress Notes (Signed)
Patient ID: Shelby Mccann, female   DOB: 1970-02-09, 44 y.o.   MRN: [id number]   Subjective:  Patient ID: Shelby Mccann, female    DOB: Sep 24, 1970  Age: 44 y.o. MRN: [id number] Tele Spanish interpreter used   CC: Establish Care; Follow-up; and Chest Pain   HPI Paraskevi D Scherrie November presents for f/u:   1. Chest pain: has hx of chest pain. CP was associated with SOB. She had a normal CXR. Normal CBC and BMP. She has not had recurrent CP or SOB. She has hx of seasonal allergies. She has dry cough. No fever or chills.  She is not taking antihistamine.   2. HM: due for flu shot. Requesting screening mammogram.    Social History  Substance Use Topics  . Smoking status: Never Smoker   . Smokeless tobacco: Not on file  . Alcohol Use: No    Outpatient Prescriptions Prior to Visit  Medication Sig Dispense Refill  . aspirin EC 81 MG tablet Take 81 mg by mouth daily.    Marland Kitchen azithromycin (ZITHROMAX) 250 MG tablet Take 2 tabs by mouth today then 1 tab by mouth daily for 4 days (Patient not taking: Reported on 07/26/2014) 6 tablet 0  . cetirizine (ZYRTEC) 10 MG tablet Take 1 tablet (10 mg total) by mouth daily. (Patient not taking: Reported on 01/14/2014) 30 tablet 3  . cetirizine (ZYRTEC) 10 MG tablet Take 1 tablet (10 mg total) by mouth daily. spanish (Patient not taking: Reported on 07/26/2014) 30 tablet 12  . cyclobenzaprine (FLEXERIL) 10 MG tablet Take 1 tablet (10 mg total) by mouth 2 (two) times daily as needed for muscle spasms. (Patient not taking: Reported on 07/26/2014) 60 tablet 1  . dextromethorphan-guaiFENesin (MUCINEX DM) 30-600 MG per 12 hr tablet Take 1 tablet by mouth 2 (two) times daily. (Patient not taking: Reported on 07/26/2014) 12 tablet 0  . fluconazole (DIFLUCAN) 150 MG tablet Take 1 tablet (150 mg total) by mouth once. (Patient not taking: Reported on 07/26/2014) 2 tablet 0  . fluticasone (FLONASE) 50 MCG/ACT nasal spray Place 2 sprays into both nostrils daily. (Patient not taking:  Reported on 07/26/2014) 16 g 2  . fluticasone (FLONASE) 50 MCG/ACT nasal spray Place 2 sprays into both nostrils daily. spanish (Patient not taking: Reported on 07/26/2014) 16 g 12  . guaiFENesin-codeine 100-10 MG/5ML syrup Take 10 mLs by mouth every 6 (six) hours as needed for cough. (Patient not taking: Reported on 07/26/2014) 120 mL 0  . ibuprofen (ADVIL,MOTRIN) 600 MG tablet Take 1 tablet (600 mg total) by mouth 2 (two) times daily as needed. (Patient not taking: Reported on 07/26/2014) 60 tablet 1  . ketotifen (ZADITOR) 0.025 % ophthalmic solution Place 1 drop into both eyes 2 (two) times daily. spanish (Patient not taking: Reported on 04/26/2014) 5 mL 12  . omeprazole (PRILOSEC) 20 MG capsule Take 1 capsule (20 mg total) by mouth daily. (Patient not taking: Reported on 10/31/2014) 20 capsule 0  . traMADol (ULTRAM) 50 MG tablet Take 1 tablet (50 mg total) by mouth at bedtime as needed (cough). spanish (Patient not taking: Reported on 07/26/2014) 10 tablet 0  . Vitamin D, Ergocalciferol, (DRISDOL) 50000 UNITS CAPS capsule Take 1 capsule (50,000 Units total) by mouth every 7 (seven) days. (Patient not taking: Reported on 07/26/2014) 12 capsule 0   No facility-administered medications prior to visit.    ROS Review of Systems  Constitutional: Negative for fever and chills.  Eyes: Negative for visual disturbance.  Respiratory: Positive for cough. Negative for shortness of breath.   Cardiovascular: Negative for chest pain.  Gastrointestinal: Negative for abdominal pain and blood in stool.  Musculoskeletal: Negative for back pain and arthralgias.  Skin: Negative for rash.  Allergic/Immunologic: Negative for immunocompromised state.  Hematological: Negative for adenopathy. Does not bruise/bleed easily.  Psychiatric/Behavioral: Negative for suicidal ideas and dysphoric mood.    Objective:  BP 105/66 mmHg  Pulse 67  Temp(Src) 98.7 F (37.1 C) (Oral)  Resp 16 [id number]$  (1.499 m)  Wt 173 lb (78.472 kg)   BMI 34.92 kg/m2  SpO2 100%  BP/Weight 10/31/2014 08/01/2014 07/26/2014  Systolic BP 105 107 96  Diastolic BP 66 74 51  Wt. (Lbs) 173 170.4 -  BMI 34.92 34.4 -   Physical Exam  Constitutional: She is oriented to person, place, and time. She appears well-developed and well-nourished. No distress.  HENT:  Head: Normocephalic and atraumatic.  Cardiovascular: Normal rate, regular rhythm, normal heart sounds and intact distal pulses.   Pulmonary/Chest: Effort normal and breath sounds normal.  Musculoskeletal: She exhibits no edema.  Neurological: She is alert and oriented to person, place, and time.  Skin: Skin is warm and dry. No rash noted.  Psychiatric: She has a normal mood and affect.     Assessment & Plan:   Problem List Items Addressed This Visit    Environmental allergies   Relevant Medications   cetirizine (ZYRTEC) 10 MG tablet   Healthcare maintenance - Primary   Relevant Orders   Flu Vaccine QUAD 36+ mos IM (Completed)   MM DIGITAL SCREENING BILATERAL      Meds ordered this encounter  Medications  . cetirizine (ZYRTEC) 10 MG tablet    Sig: Take 1 tablet (10 mg total) by mouth daily.    Dispense:  30 tablet    Refill:  11    Follow-up: Return in about 6 months (around 05/01/2015) for wellness physical .   Dessa Phi MD

## 2014-10-31 NOTE — Progress Notes (Signed)
Establish Care with new PCP Mammogram referral  F/U chest pain, no pain today No Hx tobacco  Requesting Flu Inj

## 2014-10-31 NOTE — Patient Instructions (Signed)
Shelby Mccann was seen today for establish care, follow-up and chest pain.  Diagnoses and all orders for this visit:  Healthcare maintenance -     Flu Vaccine QUAD 36+ mos IM -     MM DIGITAL SCREENING BILATERAL; Future  Environmental allergies -     cetirizine (ZYRTEC) 10 MG tablet; Take 1 tablet (10 mg total) by mouth daily.

## 2014-11-01 ENCOUNTER — Ambulatory Visit: Payer: Self-pay | Attending: Family Medicine

## 2014-11-13 ENCOUNTER — Ambulatory Visit
Admission: RE | Admit: 2014-11-13 | Discharge: 2014-11-13 | Disposition: A | Discharge status: Home / Self Care | Payer: No Typology Code available for payment source | Source: Ambulatory Visit | Attending: Family Medicine | Admitting: Family Medicine

## 2014-11-13 DIAGNOSIS — Z1231 Encounter for screening mammogram for malignant neoplasm of breast: Secondary | ICD-10-CM

## 2015-03-21 ENCOUNTER — Ambulatory Visit: Payer: Self-pay | Attending: Family Medicine

## 2015-04-04 ENCOUNTER — Ambulatory Visit: Payer: Self-pay | Attending: Family Medicine | Admitting: Family Medicine

## 2015-04-04 ENCOUNTER — Encounter: Payer: Self-pay | Admitting: Family Medicine

## 2015-04-04 VITALS — BP 100/69 | HR 86 | Temp 98.8°F | Resp 16 | Ht 60.0 in | Wt 170.0 lb

## 2015-04-04 DIAGNOSIS — L819 Disorder of pigmentation, unspecified: Secondary | ICD-10-CM

## 2015-04-04 DIAGNOSIS — L814 Other melanin hyperpigmentation: Secondary | ICD-10-CM | POA: Insufficient documentation

## 2015-04-04 DIAGNOSIS — Z79899 Other long term (current) drug therapy: Secondary | ICD-10-CM | POA: Insufficient documentation

## 2015-04-04 DIAGNOSIS — R05 Cough: Secondary | ICD-10-CM | POA: Insufficient documentation

## 2015-04-04 DIAGNOSIS — J069 Acute upper respiratory infection, unspecified: Secondary | ICD-10-CM | POA: Insufficient documentation

## 2015-04-04 MED ORDER — BENZONATATE 100 MG PO CAPS: 100.0000 mg | ORAL_CAPSULE | Freq: Two times a day (BID) | ORAL | Status: DC | PRN

## 2015-04-04 MED ORDER — AMOXICILLIN 500 MG PO CAPS: 500.0000 mg | ORAL_CAPSULE | Freq: Three times a day (TID) | ORAL | Status: DC

## 2015-04-04 MED FILL — AMOXICILLIN 500 MG CAPSULE: 500 | 7 days supply | Qty: 21 | Fill #0

## 2015-04-04 MED FILL — BENZONATATE 100 MG CAPSULE: 100 | 10 days supply | Qty: 20 | Fill #0

## 2015-04-04 NOTE — Progress Notes (Signed)
   Subjective:  Patient ID: Shelby Mccann, female    DOB: [birth year]  Age: 45 y.o. MRN: [id number]  CC: Cough   HPI Shelby Mccann Mccann presents for   1. Cough: x 2 weeks. Dry cough. No sick contacts. No history of asthma. Non smoker. Had fever and chills 6 days ago. No CP or SOB. Taking mucinex, honey with lemon and dayquil severe, amoxicillin with ibuprofen from [place]. Not able to sleep at night due to persistent cough. Has HA and congestion. Her medications has helped.   2. Dark spots on face: would like a cream. She has tried  OTC dark spot corrector and royal jelly. This darkened her skin more. She is using cream with SPF 40. Spots are painless.    Social History  Substance Use Topics  . Smoking status: Never Smoker   . Smokeless tobacco: Not on file  . Alcohol Use: No    Outpatient Prescriptions Prior to Visit  Medication Sig Dispense Refill  . cetirizine (ZYRTEC) 10 MG tablet Take 1 tablet (10 mg total) by mouth daily. 30 tablet 11   No facility-administered medications prior to visit.    ROS Review of Systems  Constitutional: Negative for fever and chills.  Eyes: Negative for visual disturbance.  Respiratory: Positive for cough. Negative for shortness of breath.   Cardiovascular: Negative for chest pain.  Gastrointestinal: Negative for abdominal pain and blood in stool.  Musculoskeletal: Negative for back pain and arthralgias.  Skin: Negative for rash.  Allergic/Immunologic: Negative for immunocompromised state.  Hematological: Negative for adenopathy. Does not bruise/bleed easily.  Psychiatric/Behavioral: Negative for suicidal ideas and dysphoric mood.    Objective:  BP 100/69 mmHg  Pulse 86  Temp(Src) 98.8 F (37.1 C) (Oral)  Resp 16  Ht 5' (1.524 m)  Wt 170 lb (77.111 kg)  BMI 33.20 kg/m2  SpO2 98%  BP/Weight 04/04/2015 10/31/2014 08/01/2014  Systolic BP 100 105 107  Diastolic BP 69 66 74  Wt. (Lbs) 170 173 170.4  BMI 33.2 34.92 34.4   Physical  Exam  Constitutional: She is oriented to person, place, and time. She appears well-developed and well-nourished. No distress.  HENT:  Head: Normocephalic and atraumatic.  Cardiovascular: Normal rate, regular rhythm, normal heart sounds and intact distal pulses.   Pulmonary/Chest: Effort normal and breath sounds normal.  Musculoskeletal: She exhibits no edema.  Neurological: She is alert and oriented to person, place, and time.  Skin: Skin is warm and dry. No rash noted.     Psychiatric: She has a normal mood and affect.   Lab Results  Component Value Date   HGBA1C 5.8* 05/07/2014    Assessment & Plan:   There are no diagnoses linked to this encounter.  Meds ordered this encounter  Medications  . benzonatate (TESSALON) 100 MG capsule    Sig: Take 1 capsule (100 mg total) by mouth 2 (two) times daily as needed for cough.    Dispense:  20 capsule    Refill:  0  . amoxicillin (AMOXIL) 500 MG capsule    Sig: Take 1 capsule (500 mg total) by mouth 3 (three) times daily.    Dispense:  21 capsule    Refill:  0    Follow-up: No Follow-up on file.   Dessa PhiJosalyn Aviona Martenson MD

## 2015-04-04 NOTE — Assessment & Plan Note (Signed)
A; age spots P: Aqua glycolic acid toner followed by SPF 40 sunscreen moisturizer recommended

## 2015-04-04 NOTE — Progress Notes (Signed)
C/C dry cough x 1 weeks Cough worsen at night  Spot on face  No pain today  No suicidal though in the past two weeks

## 2015-04-04 NOTE — Assessment & Plan Note (Signed)
A: uri with cough P: amox 10 days course  Tessalon perles

## 2015-04-04 NOTE — Patient Instructions (Addendum)
Shelby Mccann was seen today for cough.  Diagnoses and all orders for this visit:  URI (upper respiratory infection) -     benzonatate (TESSALON) 100 MG capsule; Take 1 capsule (100 mg total) by mouth 2 (two) times daily as needed for cough. -     amoxicillin (AMOXIL) 500 MG capsule; Take 1 capsule (500 mg total) by mouth 3 (three) times daily.    F/u in 3 months for wellness physical  Dr. Armen Mccann  Infeccin del tracto respiratorio superior, adultos (Upper Respiratory Infection, Adult) La mayora de las infecciones del tracto respiratorio superior son infecciones virales de las vas que llevan el aire a los pulmones. Un infeccin del tracto respiratorio superior afecta la nariz, la garganta y las vas respiratorias superiores. El tipo ms frecuente de infeccin del tracto respiratorio superior es la nasofaringitis, que habitualmente se conoce como "resfro comn". Las infecciones del tracto respiratorio superior siguen su curso y por lo general se curan solas. En la International Business Machinesmayora de los casos, la infeccin del tracto respiratorio superior no requiere atencin [place], Biomedical engineerpero a veces, despus de una infeccin viral, puede surgir una infeccin bacteriana en las vas respiratorias superiores. Esto se conoce como infeccin secundaria. Las infecciones sinusales y en el odo medio son tipos frecuentes de infecciones secundarias en el tracto respiratorio superior. La neumona bacteriana tambin puede complicar un cuadro de infeccin del tracto respiratorio superior. Este tipo de infeccin puede empeorar el asma y la enfermedad pulmonar obstructiva crnica (EPOC). En algunos casos, estas complicaciones pueden requerir atencin mdica de emergencia y poner en peligro la vida.  CAUSAS Casi todas las infecciones del tracto respiratorio superior se deben a los virus. Un virus es un tipo de microbio que puede contagiarse de Shelby Dearuna persona a Educational psychologistotra.  FACTORES DE RIESGO Puede estar en riesgo de sufrir una infeccin del tracto  respiratorio superior si:   Fuma.  Tiene una enfermedad pulmonar o cardaca crnica.  Tiene debilitado el sistema de defensa (inmunitario) del cuerpo.  Es [address] [place] [place] Entrancemuy joven o de edad muy [name].  Tiene asma o alergias nasales.  Trabaja en reas donde hay mucha gente o poca ventilacin.  Shelby Mccann en una escuela o en un centro de atencin mdica. SIGNOS Y SNTOMAS  Habitualmente, los sntomas aparecen de 2a 3das despus de entrar en contacto con el virus del resfro. La mayora de las infecciones virales en el tracto respiratorio superior duran de 7a 10das. Sin embargo, las infecciones virales en el tracto respiratorio superior a causa del virus de la gripe pueden durar de 14a 18das y, habitualmente, son ms graves. Entre los sntomas se pueden incluir los siguientes:   Secrecin o congestin nasal.  Estornudos.  Tos.  Dolor de Advertising copywritergarganta.  Dolor de Turk[place]istancabeza.  Fatiga.  Shelby RutsFiebre.  Prdida del apetito.  Dolor en la frente, detrs de los ojos y por encima de los pmulos (dolor sinusal).  Dolores musculares. DIAGNSTICO  El mdico puede diagnosticar una infeccin del tracto respiratorio superior mediante los siguientes estudios:  Examen fsico.  Pruebas para verificar si los sntomas no se deben a otra afeccin, por ejemplo:  Faringitis estreptoccica.  Sinusitis.  Neumona.  Asma. TRATAMIENTO  Esta infeccin desaparece sola, con el tiempo. No puede curarse con medicamentos, pero a menudo se prescriben para aliviar los sntomas. Los medicamentos pueden ser tiles para lo siguiente:  Personal assistantBajar la fiebre.  Reducir la tos.  Aliviar la congestin nasal. INSTRUCCIONES PARA EL CUIDADO EN EL HOGAR   Tome los medicamentos solamente como se lo haya indicado el  mdico.  A fin de Engineer, materials de garganta, haga grgaras con solucin salina templada o consuma caramelos para la tos, como se lo haya indicado el mdico.  Use un humidificador de vapor clido o inhale el vapor de  la ducha para aumentar la humedad del aire. Esto facilitar la respiracin.  Beba suficiente lquido para Photographer orina clara o de color amarillo plido.  Consuma sopas y otros caldos transparentes, y [employer].  Descanse todo lo que sea necesario.  Regrese al Aleen Mccann cuando la temperatura se le haya normalizado o cuando el mdico lo autorice. Es posible que deba quedarse en su casa durante un tiempo prolongado, para no infectar a los dems. Tambin puede usar un barbijo y lavarse las manos con cuidado para Transport planner propagacin del virus.  Aumente el uso del inhalador si tiene asma.  No consuma ningn producto que contenga tabaco, lo que incluye cigarrillos, tabaco de Theatre manager o Administrator, Civil Service. Si necesita ayuda para dejar de fumar, consulte al [employer]. PREVENCIN  La mejor manera de protegerse de un resfro es mantener una higiene [place].   Evite el contacto oral o fsico con personas que tengan sntomas de resfro.  En caso de contacto, lvese las manos con frecuencia. No hay pruebas claras de que la vitaminaC, la vitaminaE, la equincea o el ejercicio reduzcan la probabilidad de Primary school teacher un resfro. Sin embargo, siempre se recomienda Insurance account manager, hacer ejercicio y Engineering geologist.  SOLICITE ATENCIN MDICA SI:   Su estado empeora en lugar de mejorar.  Los medicamentos no Estate agent.  Tiene escalofros.  La sensacin de falta de aire empeora.  Tiene mucosidad marrn o roja.  Tiene secrecin nasal amarilla o marrn.  Le duele la cara, especialmente al inclinarse hacia adelante.  Tiene fiebre.  Tiene los ganglios del cuello hinchados.  Siente dolor al tragar.  Tiene zonas blancas en la parte de atrs de la garganta. SOLICITE ATENCIN MDICA DE INMEDIATO SI:   Tiene sntomas intensos o persistentes de:  Dolor de [place].  Dolor de odos.  Dolor sinusal.  Dolor en el pecho.  Tiene enfermedad pulmonar crnica y  cualquiera de estos sntomas:  Sibilancias.  Tos prolongada.  Tos con sangre.  Cambio en la mucosidad habitual.  Presenta rigidez en el cuello.  Tiene cambios en:  La visin.  La audicin.  El pensamiento.  El [place] de nimo. ASEGRESE DE QUE:   Comprende estas instrucciones.  Controlar su afeccin.  Recibir ayuda de inmediato si no mejora o si empeora.   Esta informacin no tiene Theme park manager el consejo del mdico. Asegrese de hacerle al mdico cualquier pregunta que tenga.   Document Released: 10/21/2004 Document Revised: 05/28/2014 Elsevier Interactive Patient Education [employer]

## 2015-04-07 ENCOUNTER — Telehealth: Payer: Self-pay | Admitting: Family Medicine

## 2015-04-07 DIAGNOSIS — B373 Candidiasis of vulva and vagina: Secondary | ICD-10-CM

## 2015-04-07 DIAGNOSIS — B3731 Acute candidiasis of vulva and vagina: Secondary | ICD-10-CM

## 2015-04-07 MED ORDER — FLUCONAZOLE 150 MG PO TABS: 150.0000 mg | ORAL_TABLET | ORAL | Status: DC

## 2015-04-07 MED FILL — FLUCONAZOLE 150 MG TABLET: 150 | 6 days supply | Qty: 2 | Fill #0

## 2015-04-07 NOTE — Telephone Encounter (Signed)
Every antibiotic in the world would potentially cause a yeast infection Patient to finish amox Take difucan for yeast  Can also get monistat OTC

## 2015-04-07 NOTE — Telephone Encounter (Signed)
Patient came into office states she's having medication reaction, patient states she has a yeast infection, patient would like different medication . Please f/u

## 2015-04-08 NOTE — Telephone Encounter (Signed)
Pt notified  Pt reported burning itching on vaginal area after taking Tessalon  No problems with amox in the past  Using Cough medication OTC and monistat Will pick up Diflucan tomorrow  Information given in Spanish

## 2015-04-28 ENCOUNTER — Telehealth: Payer: Self-pay | Admitting: Family Medicine

## 2015-04-28 MED FILL — ?CETIRIZINE HCL 10 MG TABLE: 10 | 30 days supply | Qty: 30 | Fill #1

## 2015-04-28 NOTE — Telephone Encounter (Signed)
Pt notified has refilled at [facility]

## 2015-04-28 NOTE — Telephone Encounter (Signed)
Patient came into clinic requesting medication refill on cetirizine (ZYRTEC) 10 MG tablet. Patient would like to know if she would need ov to get refill , please f/u

## 2015-05-21 ENCOUNTER — Ambulatory Visit: Payer: Self-pay | Attending: Physician Assistant | Admitting: Physician Assistant

## 2015-05-21 ENCOUNTER — Encounter: Payer: Self-pay | Admitting: Physician Assistant

## 2015-05-21 VITALS — BP 106/70 | HR 102 | Temp 98.2°F | Resp 18 | Ht 59.0 in | Wt 176.6 lb

## 2015-05-21 DIAGNOSIS — H1011 Acute atopic conjunctivitis, right eye: Secondary | ICD-10-CM | POA: Insufficient documentation

## 2015-05-21 MED ORDER — AZELASTINE HCL 0.05 % OP SOLN: 1.0000 [drp] | Freq: Two times a day (BID) | OPHTHALMIC | Status: DC

## 2015-05-21 MED ORDER — OLOPATADINE HCL 0.1 % OP SOLN: 1.0000 [drp] | Freq: Two times a day (BID) | OPHTHALMIC | Status: DC

## 2015-05-21 MED FILL — ?OLOPATADINE HCL 0.1% EYE D: 0.1% | 30 days supply | Qty: 5 | Fill #0

## 2015-05-21 NOTE — Patient Instructions (Signed)
Conjuntivitis alérgica  (Allergic Conjunctivitis)    La conjuntivitis alérgica es la inflamación de la membrana transparente que cubre la parte blanca del ojo y la cara interna del párpado (conjuntiva), y su causa son las alergias. Los vasos sanguíneos de la conjuntiva se inflaman, lo que hace que el ojo se torne de color rojo o rosa, y a menudo causa picazón en el ojo. La conjuntivitis alérgica no se transmite de una persona a la otra (no es contagiosa).  CAUSAS  La causa de esta afección es una reacción alérgica. Entre las causas comunes de una reacción alérgica (alérgenos) se incluyen las siguientes:  · Polvo.  · Polen.  · Moho.  · Caspa o secreciones de los animales.  FACTORES DE RIESGO  Es más probable que aparezca esta afección si está expuesto a altos niveles de los alérgenos que causan la reacción alérgica. Esto puede incluir estar al aire libre cuando los niveles de polen en el aire son elevados o cerca de los animales a los cuales es alérgico.  SÍNTOMAS  Los síntomas de esta afección pueden incluir lo siguiente:  · Enrojecimiento ocular.  · Secreción lagrimal de los ojos.  · Ojos llorosos.  · Picazón de los ojos.  · Sensación de ardor en los ojos.  · Secreción transparente de los ojos.  · Hinchazón de los párpados.  DIAGNÓSTICO  Este trastorno se puede diagnosticar mediante la historia clínica y un examen físico. Si tiene secreción de los ojos, se la puede analizar para descartar otras causas de la conjuntivitis.  TRATAMIENTO  El tratamiento para esta afección suele incluir medicamentos, que pueden ser gotas oftálmicas, ungüentos o medicamentos por vía oral. Pueden ser recetados o de venta libre.  INSTRUCCIONES PARA EL CUIDADO EN EL HOGAR  · Tome o aplíquese los medicamentos solamente como se lo haya indicado el médico.  · No se toque ni se frote los ojos.  · No use lentes de contacto hasta que la inflamación haya desaparecido. En cambio, use anteojos.  · No use maquillaje en los ojos hasta que la  inflamación haya desaparecido.  · Aplíquese un paño limpio y frío en el ojo durante 10 a 20 minutos, 3 a 4 veces por día.  · Trate de evitar el alérgeno que le esté causando la reacción alérgica.  SOLICITE ATENCIÓN MÉDICA SI:  · Los síntomas empeoran.  · Le supura pus del ojo.  · Aparecen nuevos síntomas.  · Tiene fiebre.     Esta información no tiene como fin reemplazar el consejo del médico. Asegúrese de hacerle al médico cualquier pregunta que tenga.     Document Released: 01/11/2005 Document Revised: 02/01/2014  Elsevier Interactive Patient Education ©2016 Elsevier Inc.

## 2015-05-21 NOTE — Progress Notes (Signed)
Patient ID: Shelby Mccann, female   DOB: 07-Oct-1970, 45 y.o.   MRN: [id number]   Lemont Fillersris [name], is a 45 y.o. female  [id number]  [id number]  DOB - 07-Oct-1970  Chief Complaint  Patient presents with  . Facial Swelling    Righ Side        Subjective:  Chief Complaint and HPI: Shelby Mccann is a 45 y.o. female here today for R eye irritation and discomfort X 6 days.  History via phone interpreter.  No h/o FB.  She has tried multiple OTC drops.  The area around the eye is puffy and swollen in the mornings and the white of her R eye is slightly red.  She denies discharge or crusting of the eye. She denies fever/chills.  Denies sinus s/sx.  She is taking zyrtec daily.  Vision is unaffected.  Denies true pain or headache.  No trauma.   ROS:   Constitutional:  No f/c, No night sweats, No unexplained weight loss. EENT:  No vision changes, No blurry vision, No hearing changes. No mouth, throat, or ear problems. Some sneezing and nasal congestion with allergies but minimal since on zyrtec Respiratory: No cough, No SOB Cardiac: No CP, no palpitations GI:  No abd pain, No N/V/D. GU: No Urinary s/sx Musculoskeletal: No joint pain Neuro: No headache, no dizziness, no motor weakness.  Skin: No rash Endocrine:  No polydipsia. No polyuria.  Psych: Denies SI/HI   ALLERGIES: Allergies  Allergen Reactions  . Tessalon [Benzonatate]     Pt reported burning, itching on vaginal area    PAST MEDICAL HISTORY: History reviewed. No pertinent past medical history.  MEDICATIONS AT HOME: Prior to Admission medications   Medication Sig Start Date End Date Taking? Authorizing Provider  cetirizine (ZYRTEC) 10 MG tablet Take 1 tablet (10 mg total) by mouth daily. 10/31/14  Yes Josalyn Funches, MD  azelastine (OPTIVAR) 0.05 % ophthalmic solution Place 1 drop into the right eye 2 (two) times daily. 05/21/15   Anders SimmondsAngela M McClung, PA-C     Objective:  EXAM:   Filed Vitals:   05/21/15 1451    BP: 106/70  Pulse: 102  Temp: 98.2 F (36.8 C)  TempSrc: Oral  Resp: 18  Height: 4\' 11"  (1.499 m)  Weight: 176 lb 9.6 oz (80.105 kg)  SpO2: 97%    General appearance : A&OX3. NAD. Non-toxic-appearing HEENT: Atraumatic and Normocephalic.  PERRLA. EOM intact.  R eye with mild erythema of bulbar and palpebral conjunctivae.  There is chemosis present in B lateral canthus. L eye unremarkable. B fundi benign.  No gross FB.  TM clear B. Mouth-MMM, post pharynx WNL w/o erythema, No PND. Neck: supple, no JVD. No cervical lymphadenopathy. No thyromegaly Chest/Lungs:  Breathing-non-labored, Good air entry bilaterally, breath sounds normal without rales, rhonchi, or wheezing  CVS: S1 S2 regular, no murmurs, gallops, rubs  Neurology:  CN II-XII grossly intact, Non focal.   Psych:  TP linear. J/I WNL. Normal speech. Appropriate eye contact and affect.  Skin:  No Rash, no lesions on tip of nose  Data Review Lab Results  Component Value Date   HGBA1C 5.8* 05/07/2014     Assessment & Plan   1. Allergic conjunctivitis, right Patanol 0.1% 1 drop to R eye BID.  - Ambulatory referral to Ophthalmology to r/o other etiology.  Patient have been counseled extensively about nutrition and exercise  F/up 3 months with PCP;  Sooner if needed.  The patient was given clear instructions  to go to ER or return to medical center if symptoms don't improve, worsen or new problems develop. The patient verbalized understanding. The patient was told to call to get lab results if they haven't heard anything in the next week.     Georgian Co, PA-C [facility] Shelby Mccann, [place] [phone]   05/21/2015, 3:16 PM

## 2015-05-21 NOTE — Progress Notes (Signed)
Patient is here for Edema  Patient denies pain at this time.  Patient complains of right sided facial discomfort. Patient states the right side of her face becomes inflamed and red. Patient has used eye drops with no relief.  Patient has not taken any medications today. Patient has eaten today.

## 2015-06-02 MED FILL — ?CETIRIZINE HCL 10 MG TABLE: 10 | 30 days supply | Qty: 30 | Fill #2

## 2016-08-18 ENCOUNTER — Encounter (HOSPITAL_COMMUNITY): Payer: Self-pay

## 2016-08-18 ENCOUNTER — Emergency Department (HOSPITAL_COMMUNITY)
Admission: EM | Admit: 2016-08-18 | Discharge: 2016-08-19 | Disposition: A | Discharge status: Home / Self Care | Payer: Self-pay | Attending: Emergency Medicine | Admitting: Emergency Medicine

## 2016-08-18 ENCOUNTER — Emergency Department (HOSPITAL_COMMUNITY): Payer: Self-pay

## 2016-08-18 DIAGNOSIS — J40 Bronchitis, not specified as acute or chronic: Secondary | ICD-10-CM | POA: Insufficient documentation

## 2016-08-18 DIAGNOSIS — J029 Acute pharyngitis, unspecified: Secondary | ICD-10-CM

## 2016-08-18 DIAGNOSIS — J069 Acute upper respiratory infection, unspecified: Secondary | ICD-10-CM | POA: Insufficient documentation

## 2016-08-18 DIAGNOSIS — Z79899 Other long term (current) drug therapy: Secondary | ICD-10-CM | POA: Insufficient documentation

## 2016-08-18 LAB — RAPID STREP SCREEN (MED CTR MEBANE ONLY): Streptococcus, Group A Screen (Direct): NEGATIVE

## 2016-08-18 MED ORDER — ALBUTEROL SULFATE HFA 108 (90 BASE) MCG/ACT IN AERS
2.0000 | INHALATION_SPRAY | RESPIRATORY_TRACT | Status: DC | PRN
  Administered 2016-08-18: 2 via RESPIRATORY_TRACT
  Filled 2016-08-18: qty 6.7

## 2016-08-18 MED ORDER — HYDROCOD POLST-CPM POLST ER 10-8 MG/5ML PO SUER: 5.0000 mL | Freq: Two times a day (BID) | ORAL | 0 refills | Status: DC | PRN

## 2016-08-18 MED ORDER — HYDROCOD POLST-CPM POLST ER 10-8 MG/5ML PO SUER
5.0000 mL | Freq: Once | ORAL | Status: AC
  Administered 2016-08-18: 5 mL via ORAL
  Filled 2016-08-18: qty 5

## 2016-08-18 MED ORDER — AZITHROMYCIN 250 MG PO TABS: 250.0000 mg | ORAL_TABLET | Freq: Every day | ORAL | 0 refills | Status: DC

## 2016-08-18 NOTE — ED Triage Notes (Signed)
Pt here for allergies, uri, sinus congestion, and sore throat for several days, cough as well.

## 2016-08-18 NOTE — ED Notes (Signed)
Patient transported to X-ray 

## 2016-08-18 NOTE — ED Provider Notes (Signed)
[facility]-EMERGENCY DEPT Provider Note   CSN: [id number] Arrival date & time: 08/18/16  2153  By signing my name below, I, Shelby Mccann, attest that this documentation has been prepared under the direction and in the presence of [facility], [place].  Electronically Signed: Rosario Mccann, ED Scribe. 08/18/16. 10:40 PM.  History   Chief Complaint Chief Complaint  Patient presents with  . Sore Throat  . URI   The history is provided by the patient. No language interpreter was used.  Cough  This is a new problem. The current episode started 2 days ago. The problem occurs constantly. The problem has been gradually worsening. The cough is productive of sputum. There has been no fever. Associated symptoms include sore throat. Pertinent negatives include no chest pain, no chills and no ear pain. Treatments tried: Amoxicillin + OTC medications. The treatment provided no relief. She is not a smoker.    HPI Comments: Shelby Mccann is a 46 y.o. female who presents to the Emergency Department complaining of persistent, worsening cough productive of sputum beginning two days ago. She reports associated sore throat secondary to the onset of her cough. She took one left over Amoxicillin dose and OTC daytime flu medication without significant relief of her symptoms. No sick contacts w/ similar symptoms. Pt denies nausea, vomiting, fever, chills, ear pain, or any other associated symptoms. Immunizations UTD.   History reviewed. No pertinent past medical history.  Patient Active Problem List   Diagnosis Date Noted  . URI (upper respiratory infection) 04/04/2015  . Age spots 04/04/2015  . Other and unspecified hyperlipidemia 02/28/2013  . Environmental allergies 01/17/2013   Past Surgical History:  Procedure Laterality Date  . ABDOMINAL HYSTERECTOMY  2013    uterus fell   . CESAREAN SECTION     OB History    Gravida Para Term Preterm AB Living   0 0 0 0 0     SAB TAB Ectopic Multiple  Live Births   0 0 0         Home Medications    Prior to Admission medications   Medication Sig Start Date End Date Taking? Authorizing Provider  azithromycin (ZITHROMAX) 250 MG tablet Take 1 tablet (250 mg total) by mouth daily. Take first 2 tablets together, then 1 every day until finished. 08/18/16   Shelby Napoleon, NP  cetirizine (ZYRTEC) 10 MG tablet Take 1 tablet (10 mg total) by mouth daily. 10/31/14   Funches, Gerilyn Nestle, MD  chlorpheniramine-HYDROcodone (TUSSIONEX PENNKINETIC ER) 10-8 MG/5ML SUER Take 5 mLs by mouth every 12 (twelve) hours as needed for cough. 08/18/16   Shelby Napoleon, NP  olopatadine (PATANOL) 0.1 % ophthalmic solution Place 1 drop into the right eye 2 (two) times daily. 05/21/15   Anders Simmonds, PA-C   Family History Family History  Problem Relation Age of Onset  . Cancer Mother        utetrine cancer   . Cancer Brother    Social History Social History  Substance Use Topics  . Smoking status: Never Smoker  . Smokeless tobacco: Not on file  . Alcohol use No   Allergies   Tessalon [benzonatate]  Review of Systems Review of Systems  Constitutional: Negative for chills and fever.  HENT: Positive for congestion, sinus pressure and sore throat. Negative for ear pain.   Eyes: Negative for pain and discharge.  Respiratory: Positive for cough.   Cardiovascular: Negative for chest pain.  Gastrointestinal: Negative for abdominal pain,  nausea and vomiting.  Genitourinary: Negative for dysuria and frequency.  Musculoskeletal: Negative for back pain.  Skin: Negative for rash.  Psychiatric/Behavioral: The patient is not nervous/anxious.    Physical Exam Updated Vital Signs BP 129/73 (BP Location: Left Arm)   Pulse 100   Temp 98.3 F (36.8 C) (Oral)   Resp 18   SpO2 98%   Physical Exam  Constitutional: She appears well-developed and well-nourished. No distress.  HENT:  Head: Normocephalic and atraumatic.  Right Ear: Tympanic membrane and ear canal  normal.  Left Ear: Tympanic membrane and ear canal normal.  Nose: Nose normal.  Mouth/Throat: Uvula is midline and mucous membranes are normal. No trismus in the jaw. No uvula swelling. Posterior oropharyngeal erythema present. No posterior oropharyngeal edema.  Eyes: Pupils are equal, round, and reactive to light. Conjunctivae and EOM are normal. No scleral icterus.  Neck: Normal range of motion.  Cardiovascular: Normal rate and regular rhythm.   No murmur heard. Pulmonary/Chest: Effort normal.  No rales or wheezing heard to auscultation.   Abdominal: Soft. She exhibits no distension. There is no tenderness.  Musculoskeletal: Normal range of motion.  Lymphadenopathy:    She has no cervical adenopathy.  Neurological: She is alert.  Skin: Skin is warm and dry.  Psychiatric: She has a normal mood and affect. Her behavior is normal.  Nursing note and vitals reviewed.  ED Treatments / Results  DIAGNOSTIC STUDIES: Oxygen Saturation is 98% on RA, normal by my interpretation.   COORDINATION OF CARE: 10:40 PM-Discussed next steps with pt. Pt verbalized understanding and is agreeable with the plan.   Labs (all labs ordered are listed, but only abnormal results are displayed) Labs Reviewed  RAPID STREP SCREEN (NOT AT [facility])  CULTURE, GROUP A STREP [facility])  Radiology Dg Chest 2 View  Result Date: 08/18/2016 CLINICAL DATA:  Shortness of breath tonight. EXAM: CHEST  2 VIEW COMPARISON:  07/26/2014 FINDINGS: Heart size and pulmonary vascularity are normal for technique. Shallow inspiration. No airspace disease or consolidation in the lungs. No blunting of costophrenic angles. No pneumothorax. Mediastinal contours appear intact. IMPRESSION: No active cardiopulmonary disease. Electronically Signed   By: Burman NievesWilliam  Stevens M.D.   On: 08/18/2016 23:24    Procedures Procedures   Medications Ordered in ED Medications  albuterol (PROVENTIL HFA;VENTOLIN HFA) 108 (90 Base) MCG/ACT inhaler 2 puff (2  puffs Inhalation Given 08/18/16 2356)  chlorpheniramine-HYDROcodone (TUSSIONEX) 10-8 MG/5ML suspension 5 mL (5 mLs Oral Given 08/18/16 2249)    Initial Impression / Assessment and Plan / ED Course  I have reviewed the triage vital signs and the nursing notes. Pt CXR negative for acute infiltrate. Patients symptoms are consistent with bronchitis. Persistent cough but patient reports UTD on immunizations, doubt pertusis. Will give Rx for Z-pak and tussionex. Patient  Verbalizes understanding and is agreeable with plan. Pt is hemodynamically stable & in NAD prior to dc.  Final Clinical Impressions(s) / ED Diagnoses   Final diagnoses:  Bronchitis  Sore throat   New Prescriptions Discharge Medication List as of 08/18/2016 11:50 PM    START taking these medications   Details  azithromycin (ZITHROMAX) 250 MG tablet Take 1 tablet (250 mg total) by mouth daily. Take first 2 tablets together, then 1 every day until finished., Starting Wed 08/18/2016, Print    chlorpheniramine-HYDROcodone (TUSSIONEX PENNKINETIC ER) 10-8 MG/5ML SUER Take 5 mLs by mouth every 12 (twelve) hours as needed for cough., Starting Wed 08/18/2016, Print      I personally performed the  services described in this documentation, which was scribed in my presence. The recorded information has been reviewed and is accurate.    Kerrie Buffaloeese, Kasten Leveque TrinityM, [place]NP 08/19/16 [id number]    Arby BarrettePfeiffer, Marcy, MD 08/23/16 1300

## 2016-08-18 NOTE — ED Notes (Signed)
ED Provider at bedside. 

## 2016-08-18 NOTE — ED Notes (Signed)
Pt states that she has been taking amoxicillin at home. States she took 1 500mg  pill. And has been taking daytime flu medication x 2 days.

## 2016-08-18 NOTE — Discharge Instructions (Signed)
Your strep screen tonight was negative. Your chest x-ray was normal. Use the inhaler 2 puffs every 4 hours as needed for cough and wheezing. Follow up with your doctor or return here for worsening symptoms.

## 2016-08-19 NOTE — ED Notes (Signed)
Pt departed in NAD, refused use of wheelchair.  

## 2016-08-21 LAB — CULTURE, GROUP A STREP (THRC)

## 2016-09-17 IMAGING — US US ABDOMEN COMPLETE
1 series · 14 of 25 positions shown · non-contrast
Comparison: CT abdomen and pelvis December 27, 2009

CLINICAL DATA: Upper abdominal pain, primarily on the right

EXAM:
ULTRASOUND ABDOMEN COMPLETE

[Series 1: us abdomen complete · 0.20mm/px · 14 of 80 slices shown]
[im 1/80]
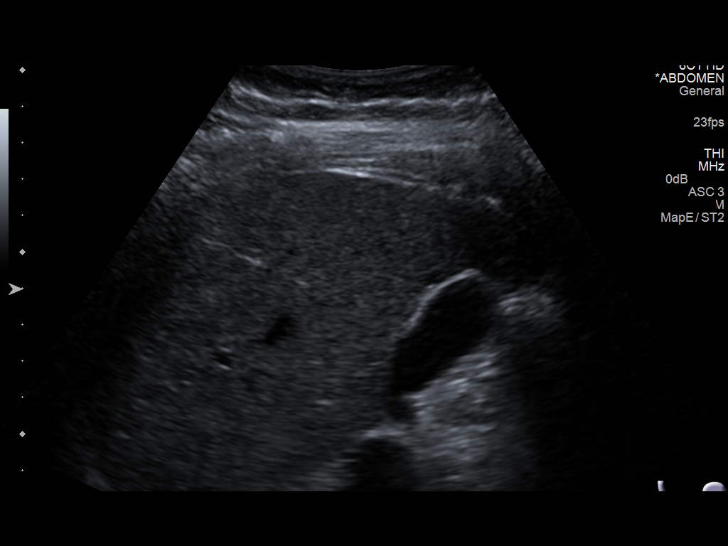
[im 7/80]
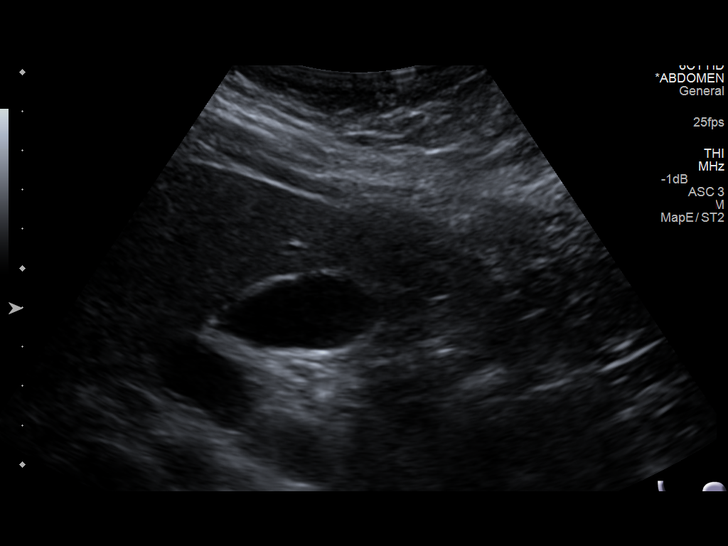
[im 14/80]
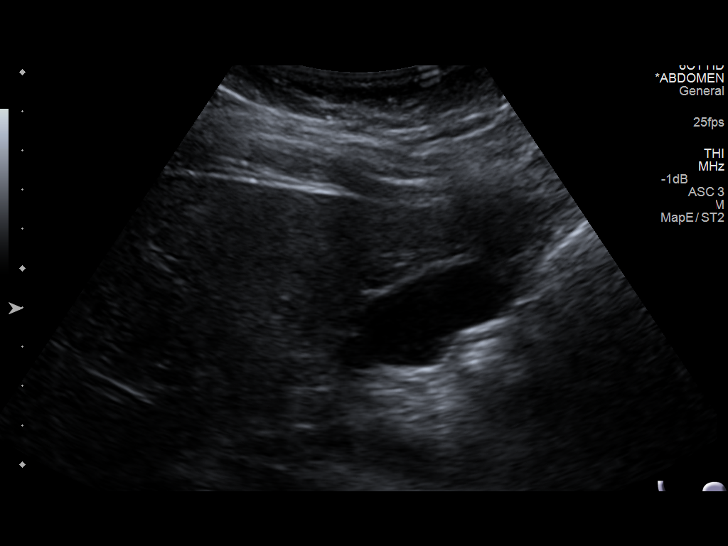
[im 20/80]
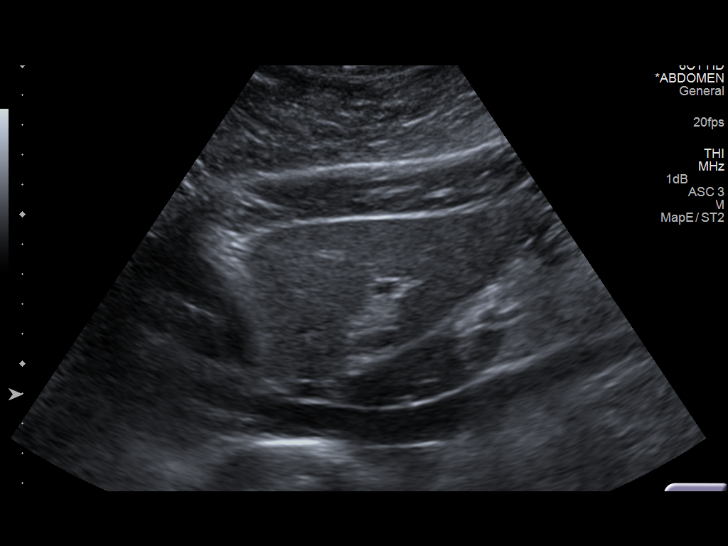
[im 27/80]
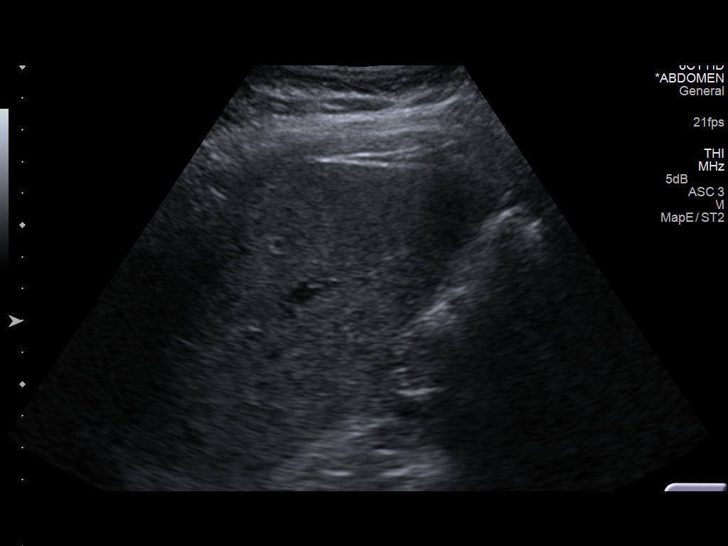
[im 30/80]
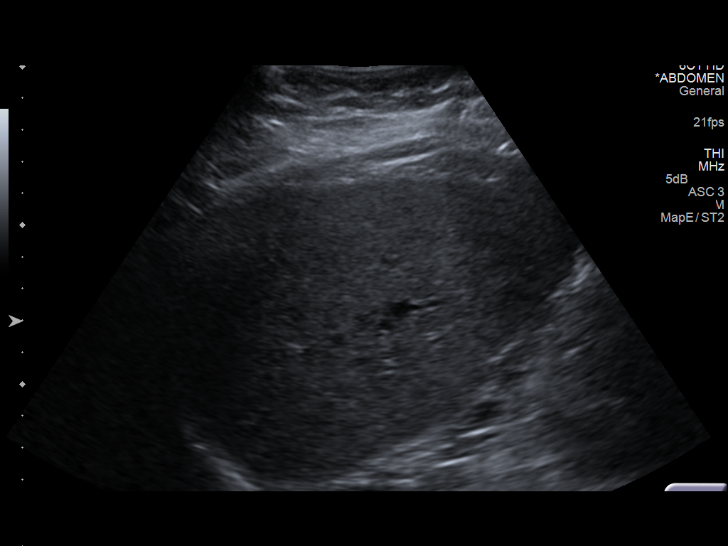
[im 37/80]
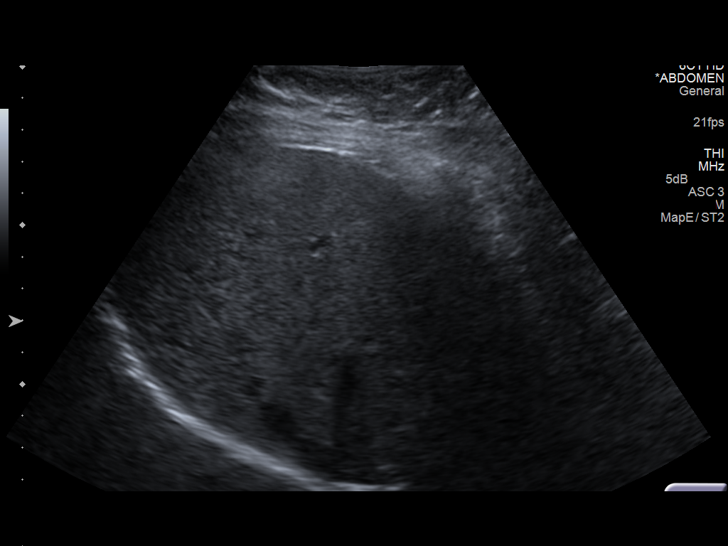
[im 43/80]
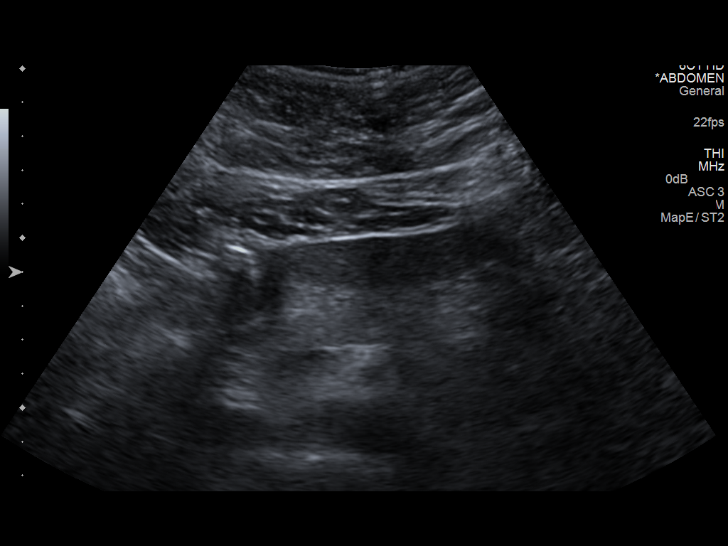
[im 50/80]
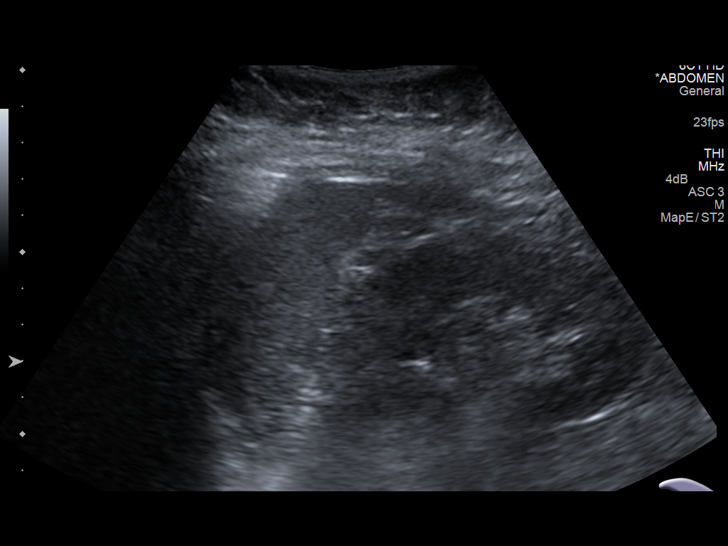
[im 53/80]
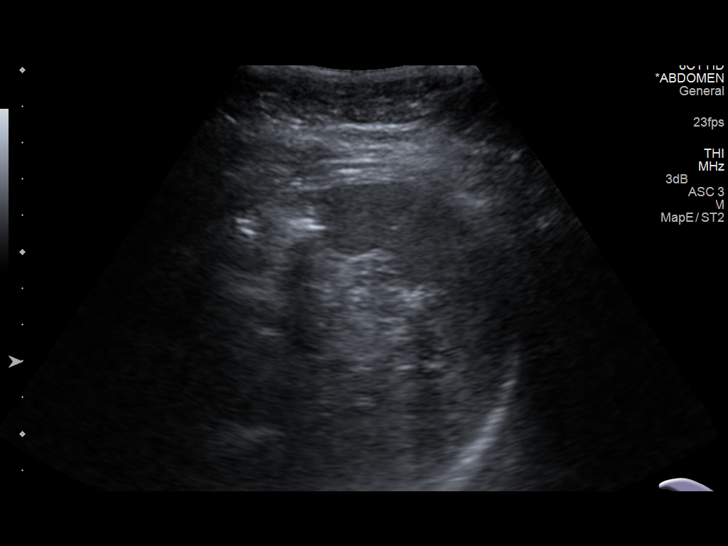
[im 60/80]
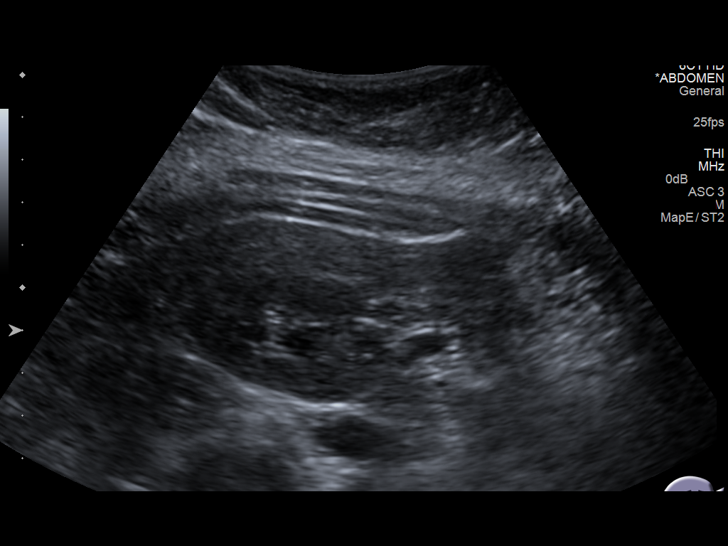
[im 66/80]
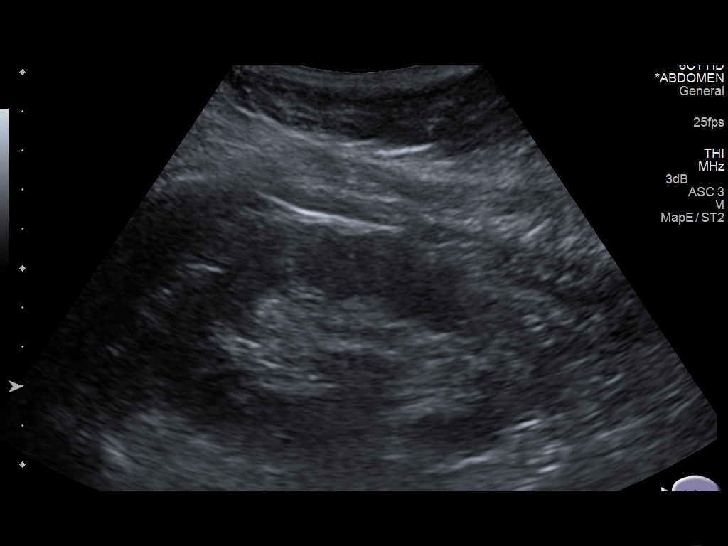
[im 73/80]
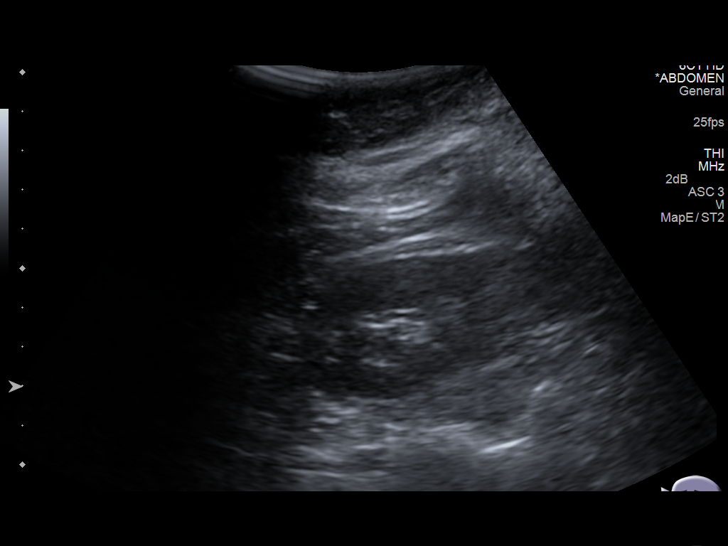
[im 80/80]
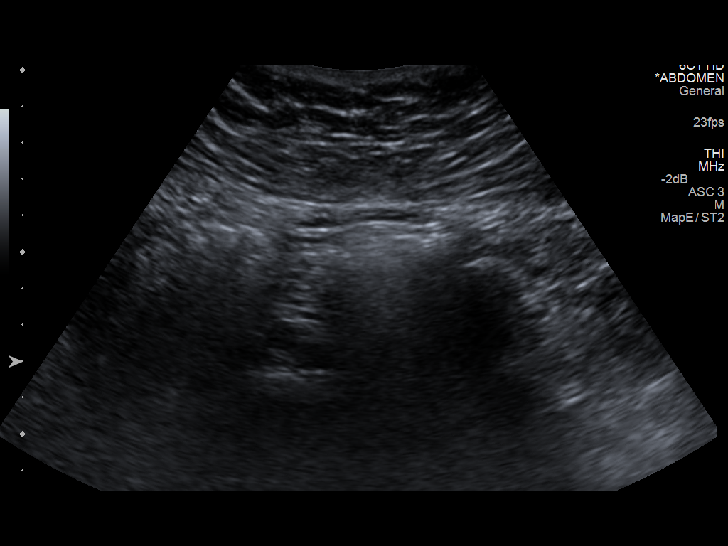

[14 of 25 positions shown; findings below may reference images not displayed]

FINDINGS: Gallbladder: No gallstones or wall thickening visualized. There is
no pericholecystic fluid. No sonographic Murphy sign noted.

Common bile duct: Diameter: 4 mm. There is no intrahepatic, common
hepatic, common bile duct dilatation.

Liver: No focal lesion identified. Within normal limits in
parenchymal echogenicity.

IVC: No abnormality visualized.

Pancreas: Visualized portion unremarkable. Portions of pancreas are
obscured by gas.

Spleen: Size and appearance within normal limits.

Right Kidney: Length: 11.2 cm. Echogenicity within normal limits. No
mass or hydronephrosis visualized.

Left Kidney: Length: 11.1 cm. Echogenicity within normal limits. No
mass or hydronephrosis visualized.

Abdominal aorta: No aneurysm visualized.

Other findings: No demonstrable ascites.
IMPRESSION: Portions of pancreas obscured by gas. Visualized portions of
pancreas appear unremarkable. Study otherwise unremarkable.

## 2016-10-18 ENCOUNTER — Ambulatory Visit (INDEPENDENT_AMBULATORY_CARE_PROVIDER_SITE_OTHER): Payer: Self-pay | Admitting: Physician Assistant

## 2016-10-18 ENCOUNTER — Encounter (INDEPENDENT_AMBULATORY_CARE_PROVIDER_SITE_OTHER): Payer: Self-pay | Admitting: Physician Assistant

## 2016-10-18 VITALS — BP 94/67 | HR 77 | Temp 98.3°F | Ht 59.06 in | Wt 174.2 lb

## 2016-10-18 DIAGNOSIS — L811 Chloasma: Secondary | ICD-10-CM

## 2016-10-18 DIAGNOSIS — Z23 Encounter for immunization: Secondary | ICD-10-CM

## 2016-10-18 DIAGNOSIS — Z131 Encounter for screening for diabetes mellitus: Secondary | ICD-10-CM

## 2016-10-18 DIAGNOSIS — Z9109 Other allergy status, other than to drugs and biological substances: Secondary | ICD-10-CM

## 2016-10-18 LAB — POCT GLYCOSYLATED HEMOGLOBIN (HGB A1C): HEMOGLOBIN A1C: 5.8

## 2016-10-18 MED ORDER — FLUTICASONE PROPIONATE 50 MCG/ACT NA SUSP: 2.0000 | Freq: Every day | NASAL | 6 refills | Status: DC

## 2016-10-18 MED ORDER — CETIRIZINE HCL 10 MG PO TABS: 10.0000 mg | ORAL_TABLET | Freq: Every day | ORAL | 11 refills | Status: DC

## 2016-10-18 MED ORDER — HYDROQUINONE 4 % EX CREA: TOPICAL_CREAM | Freq: Two times a day (BID) | CUTANEOUS | 0 refills | Status: DC

## 2016-10-18 MED FILL — HYDROQUINONE 4% CREAM: 4 | 15 days supply | Qty: 28 | Fill #0

## 2016-10-18 MED FILL — ?CETIRIZINE HCL 10 MG TABLE: 10 | 30 days supply | Qty: 30 | Fill #0

## 2016-10-18 MED FILL — FLUTICASONE PROP 50 MCG SPR: 50 | 30 days supply | Qty: 16 | Fill #0

## 2016-10-18 NOTE — Patient Instructions (Signed)
Melasma (Melasma) El melasma es una afeccin de la piel que se caracteriza por zonas de coloracin ms oscura. Suele presentarse en las mejillas, la frente, el labio superior y el cuello. No se trasmite de una persona a otra (no es contagioso). Estas manchas pueden tener forma de mscara. Las zonas pigmentadas no causan picazn y no estn enrojecidas ni hinchadas. CAUSAS No se conoce la causa del melasma. Sin embargo, ciertos factores pueden desencadenarlo, por ejemplo:  La exposicin al sol.  Las alergias a medicamentos o cosmticos.  Las variaciones hormonales, tales como: ? Tomar anticonceptivos. ? Recibir terapia de reemplazo hormonal. ? Estar embarazada. FACTORES DE RIESGO Entre los factores de riesgo de tener melasma se incluye lo siguiente:  Ser mujer. Es menos frecuente en los hombres.  Tener antecedentes familiares de melasma.  Tener piel ms oscura.  Vivir en un clima tropical. SIGNOS Y SNTOMAS No hay sntomas, salvo el cambio de color de la piel con manchas oscuras o de color tostado. DIAGNSTICO El melasma se diagnostica en funcin del aspecto que presenta. El mdico puede usar una luz ultravioleta llamada lmpara de [place] para observar la piel ms de cerca. Tambin puede tomarle una pequea muestra de piel (biopsia). Esto se realiza para asegurarse de que el melasma no se deba a otra enfermedad cutnea. TRATAMIENTO No hay cura para el melasma. Sin embargo, hay algunos tratamientos disponibles que pueden aclarar el color de las manchas ms oscuras. El tratamiento puede incluir lo siguiente:  Medicamentos, como cremas blanqueadoras o con corticoides.  Descamaciones faciales o quimioabrasin.  Tratamiento con lser.  Dermoabrasin o microdermoabrasin. El melasma tambin puede desaparecer solo con el tiempo. INSTRUCCIONES PARA EL CUIDADO EN EL HOGAR  Evite la sobreexposicin al sol, especialmente en las zonas tropicales.  Use todos los das una pantalla solar con  factor de proteccin solar (FPS) de30 o ms.  Aplquese los medicamentos solamente como se lo haya indicado el mdico.  Use un sombrero que le proteja la cara del sol.  Concurra a todas las visitas de control como se lo haya indicado el mdico. Esto es importante. PREVENCIN  Use todos los das una pantalla solar con factor de proteccin solar (FPS) de30 o ms.  Use un sombrero que le proteja la cara del sol.  No use cera para retirar el exceso de vello de las zonas donde tiene o tuvo melasma.  Evite la sobreexposicin al sol, especialmente en las zonas tropicales.  Use cosmticos suaves especiales para pieles sensibles. SOLICITE ATENCIN MDICA SI:  Aparecen nuevos sntomas.  Los sntomas empeoran.  Las zonas afectadas de la piel: ? Sangran. ? Estn irritadas. Esta informacin no tiene como fin reemplazar el consejo del mdico. Asegrese de hacerle al mdico cualquier pregunta que tenga. Document Released: 06/30/2007 Document Revised: 02/01/2014 Document Reviewed: 08/14/2013 Elsevier Interactive Patient Education  2018 Elsevier Inc.  

## 2016-10-18 NOTE — Progress Notes (Signed)
Subjective:  Patient ID: Shelby Mccann, female    DOB: Oct 17, 1970  Age: 46 y.o. MRN: [id number]  CC: allergies  HPI Shelby Mccann is a 46 y.o. female with a medical history of environmental allergies presents as a new patient to discuss her allergies. Has allergies ever since moving to [place] from [place]. Takes cetirizine with moderate relief of symptoms. Symptoms include nasal congestion, sneezing, and coughing.     Also has dark spots on the cheeks "for many years". Has tried several nonprescribed products to whiten or "erase" the hyperpigmentation but to no effect. No hx of autoimmune disease. Does not endorse any other symptoms or complaints.    Outpatient Medications Prior to Visit  Medication Sig Dispense Refill  . azithromycin (ZITHROMAX) 250 MG tablet Take 1 tablet (250 mg total) by mouth daily. Take first 2 tablets together, then 1 every day until finished. (Patient not taking: Reported on 10/18/2016) 6 tablet 0  . cetirizine (ZYRTEC) 10 MG tablet Take 1 tablet (10 mg total) by mouth daily. (Patient not taking: Reported on 10/18/2016) 30 tablet 11  . chlorpheniramine-HYDROcodone (TUSSIONEX PENNKINETIC ER) 10-8 MG/5ML SUER Take 5 mLs by mouth every 12 (twelve) hours as needed for cough. (Patient not taking: Reported on 10/18/2016) 115 mL 0  . olopatadine (PATANOL) 0.1 % ophthalmic solution Place 1 drop into the right eye 2 (two) times daily. (Patient not taking: Reported on 10/18/2016) 5 mL 12   No facility-administered medications prior to visit.      ROS Review of Systems  Constitutional: Negative for chills, fever and malaise/fatigue.  Eyes: Negative for blurred vision.  Respiratory: Negative for shortness of breath.   Cardiovascular: Negative for chest pain and palpitations.  Gastrointestinal: Negative for abdominal pain and nausea.  Genitourinary: Negative for dysuria and hematuria.  Musculoskeletal: Negative for joint pain and myalgias.  Skin: Positive for  rash.  Neurological: Negative for tingling and headaches.  Psychiatric/Behavioral: Negative for depression. The patient is not nervous/anxious.     Objective:  BP 94/67 (BP Location: Left Arm, Patient Position: Sitting, Cuff Size: Normal)   Pulse 77   Temp 98.3 F (36.8 C) (Oral)   Ht 4' 11.06" (1.5 m)   Wt 174 lb 3.2 oz (79 kg)   SpO2 96%   BMI 35.12 kg/m   BP/Weight 10/18/2016 08/18/2016 05/21/2015  Systolic BP 94 129 106  Diastolic BP 67 73 70  Wt. (Lbs) 174.2 - 176.6  BMI 35.12 - 35.65      Physical Exam  Constitutional: She is oriented to person, place, and time.  Well developed, well nourished, NAD, polite  HENT:  Head: Normocephalic and atraumatic.  Eyes: No scleral icterus.  Neck: Normal range of motion.  Cardiovascular: Normal rate, regular rhythm and normal heart sounds.   Pulmonary/Chest: Effort normal and breath sounds normal.  Musculoskeletal: She exhibits no edema.  Neurological: She is alert and oriented to person, place, and time.  Skin: Skin is warm and dry. No rash noted. No erythema. No pallor.  Small patch of hyperpigmented skin on the upper cheek L > R.  Psychiatric: She has a normal mood and affect. Her behavior is normal. Thought content normal.  Vitals reviewed.    Assessment & Plan:    1. Environmental allergies - Allergens, Zone 2 - Begin cetirizine (ZYRTEC) 10 MG tablet; Take 1 tablet (10 mg total) by mouth daily.  Dispense: 30 tablet; Refill: 11 - Begin fluticasone (FLONASE) 50 MCG/ACT nasal spray; Place 2  sprays into both nostrils daily.  Dispense: 16 g; Refill: 6 - CBC with Differential - Comprehensive metabolic panel  2. Melasma - Begin hydroquinone 4 % cream; Apply topically 2 (two) times daily.  Dispense: 28.35 g; Refill: 0 - Advised patient to apply SPF 30 or higher onto face and to avoid excessive sunlight whenever possible. - ANA w/Reflex  3. Screening for diabetes mellitus - HgB A1c 5.8% in clinic today  4. Need for Tdap  vaccination - Tdap vaccine greater than or equal to 7yo IM  5. Need for influenza vaccination - Flu Vaccine QUAD 6+ mos PF IM (Fluarix Quad PF)   Meds ordered this encounter  Medications  . hydroquinone 4 % cream    Sig: Apply topically 2 (two) times daily.    Dispense:  28.35 g    Refill:  0    Order Specific Question:   Supervising Provider    Answer:   Quentin Angst [id number]  . cetirizine (ZYRTEC) 10 MG tablet    Sig: Take 1 tablet (10 mg total) by mouth daily.    Dispense:  30 tablet    Refill:  11    Order Specific Question:   Supervising Provider    Answer:   Quentin Angst [id number]  . fluticasone (FLONASE) 50 MCG/ACT nasal spray    Sig: Place 2 sprays into both nostrils daily.    Dispense:  16 g    Refill:  6    Order Specific Question:   Supervising Provider    Answer:   Quentin Angst [id number]    Follow-up: Return if symptoms worsen or fail to improve.   Loletta Specter PA

## 2016-10-20 LAB — ALLERGENS, ZONE 2
AMER SYCAMORE IGE QN: 0.16 kU/L — AB
Bahia Grass IgE: 2.27 kU/L — AB
Cockroach, American IgE: <0.1 kU/L
Common Silver Birch IgE: 0.11 kU/L — AB
D Farinae IgE: <0.1 kU/L
D Pteronyssinus IgE: <0.1 kU/L
E001-IGE CAT DANDER: 0.1 kU/L — AB
Elm, American IgE: 0.62 kU/L — AB
G002-IGE BERMUDA GRASS: 0.26 kU/L — AB
Johnson Grass IgE: 1.35 kU/L — AB
Maple/Box Elder IgE: 0.16 kU/L — AB
Mugwort IgE Qn: <0.1 kU/L
Pigweed, Rough IgE: <0.1 kU/L
Plantain, English IgE: <0.1 kU/L
Ragweed, Short IgE: 0.15 kU/L — AB
SHEEP SORREL IGE QN: 0.11 kU/L — AB
SWEET GUM IGE RAST QL: 0.19 kU/L — AB
Stemphylium Herbarum IgE: <0.1 kU/L
T007-IGE OAK, WHITE: 0.15 kU/L — AB
T041-IGE HICKORY, WHITE: 9.44 kU/L — AB
TIMOTHY IGE: 6.07 kU/L — AB

## 2016-10-20 LAB — ANA W/REFLEX: Anti Nuclear Antibody(ANA): NEGATIVE

## 2016-10-20 LAB — CBC WITH DIFFERENTIAL/PLATELET
BASOS: 0 %
Basophils Absolute: 0 10*3/uL (ref 0.0–0.2)
EOS (ABSOLUTE): 0.1 10*3/uL (ref 0.0–0.4)
EOS: 2 %
HEMATOCRIT: 41.7 % (ref 34.0–46.6)
HEMOGLOBIN: 14.2 g/dL (ref 11.1–15.9)
Immature Grans (Abs): 0 10*3/uL (ref 0.0–0.1)
Immature Granulocytes: 0 %
Lymphocytes Absolute: 2.3 10*3/uL (ref 0.7–3.1)
Lymphs: 40 %
MCH: 28.5 pg (ref 26.6–33.0)
MCHC: 34.1 g/dL (ref 31.5–35.7)
MCV: 84 fL (ref 79–97)
MONOCYTES: 5 %
Monocytes Absolute: 0.3 10*3/uL (ref 0.1–0.9)
Neutrophils Absolute: 3 10*3/uL (ref 1.4–7.0)
Neutrophils: 53 %
Platelets: 327 10*3/uL (ref 150–379)
RBC: 4.99 x10E6/uL (ref 3.77–5.28)
RDW: 15.5 % — AB (ref 12.3–15.4)
WBC: 5.8 10*3/uL (ref 3.4–10.8)

## 2016-10-20 LAB — COMPREHENSIVE METABOLIC PANEL
A/G RATIO: 1.6 (ref 1.2–2.2)
ALBUMIN: 4.6 g/dL (ref 3.5–5.5)
ALK PHOS: 106 IU/L (ref 39–117)
ALT: 19 IU/L (ref 0–32)
AST: 18 IU/L (ref 0–40)
BUN / CREAT RATIO: 14 (ref 9–23)
BUN: 9 mg/dL (ref 6–24)
Bilirubin Total: <0.2 mg/dL (ref 0.0–1.2)
CALCIUM: 10.3 mg/dL — AB (ref 8.7–10.2)
CO2: 25 mmol/L (ref 20–29)
CREATININE: 0.66 mg/dL (ref 0.57–1.00)
Chloride: 103 mmol/L (ref 96–106)
GFR calc Af Amer: 122 mL/min/{1.73_m2} (ref >59)
GFR, EST NON AFRICAN AMERICAN: 106 mL/min/{1.73_m2} (ref >59)
GLOBULIN, TOTAL: 2.9 g/dL (ref 1.5–4.5)
Glucose: 87 mg/dL (ref 65–99)
POTASSIUM: 4.1 mmol/L (ref 3.5–5.2)
SODIUM: 142 mmol/L (ref 134–144)
Total Protein: 7.5 g/dL (ref 6.0–8.5)

## 2016-10-27 ENCOUNTER — Telehealth (INDEPENDENT_AMBULATORY_CARE_PROVIDER_SITE_OTHER): Payer: Self-pay

## 2016-10-27 NOTE — Telephone Encounter (Signed)
-----   Message from Loletta Specter, PA-C sent at 10/20/2016  1:30 PM EDT ----- Autoimmune disease negative. Several environmental allergies. No anemia or sign of infection. Normal liver and kidney results.

## 2016-10-27 NOTE — Telephone Encounter (Signed)
Using [employer] interpre[employer] [phone], provided lab results to patients husband, he relayed results back to me to ensure understanding. Husband will give results to patient. Maryjean Morn, CMA

## 2017-01-03 ENCOUNTER — Emergency Department (HOSPITAL_COMMUNITY): Payer: Self-pay

## 2017-01-03 ENCOUNTER — Encounter (HOSPITAL_COMMUNITY): Payer: Self-pay

## 2017-01-03 ENCOUNTER — Other Ambulatory Visit: Payer: Self-pay

## 2017-01-03 ENCOUNTER — Emergency Department (HOSPITAL_COMMUNITY)
Admission: EM | Admit: 2017-01-03 | Discharge: 2017-01-03 | Disposition: A | Discharge status: Home / Self Care | Payer: Self-pay | Attending: Emergency Medicine | Admitting: Emergency Medicine

## 2017-01-03 DIAGNOSIS — R109 Unspecified abdominal pain: Secondary | ICD-10-CM

## 2017-01-03 DIAGNOSIS — Z79899 Other long term (current) drug therapy: Secondary | ICD-10-CM | POA: Insufficient documentation

## 2017-01-03 DIAGNOSIS — R1030 Lower abdominal pain, unspecified: Secondary | ICD-10-CM | POA: Insufficient documentation

## 2017-01-03 LAB — URINALYSIS, ROUTINE W REFLEX MICROSCOPIC
Bilirubin Urine: NEGATIVE
GLUCOSE, UA: NEGATIVE mg/dL
Hgb urine dipstick: NEGATIVE
Ketones, ur: NEGATIVE mg/dL
LEUKOCYTES UA: NEGATIVE
NITRITE: NEGATIVE
PROTEIN: NEGATIVE mg/dL
Specific Gravity, Urine: 1.01 (ref 1.005–1.030)
pH: 6 (ref 5.0–8.0)

## 2017-01-03 LAB — I-STAT CHEM 8, ED
BUN: 21 mg/dL — AB (ref 6–20)
CALCIUM ION: 1.17 mmol/L (ref 1.15–1.40)
CHLORIDE: 106 mmol/L (ref 101–111)
Creatinine, Ser: 0.9 mg/dL (ref 0.44–1.00)
GLUCOSE: 106 mg/dL — AB (ref 65–99)
HCT: 42 % (ref 36.0–46.0)
Hemoglobin: 14.3 g/dL (ref 12.0–15.0)
Potassium: 3.9 mmol/L (ref 3.5–5.1)
Sodium: 140 mmol/L (ref 135–145)
TCO2: 27 mmol/L (ref 22–32)

## 2017-01-03 LAB — CBC WITH DIFFERENTIAL/PLATELET
Basophils Absolute: 0 10*3/uL (ref 0.0–0.1)
Basophils Relative: 0 %
Eosinophils Absolute: 0.2 10*3/uL (ref 0.0–0.7)
Eosinophils Relative: 2 %
HCT: 40.9 % (ref 36.0–46.0)
HEMOGLOBIN: 14.1 g/dL (ref 12.0–15.0)
LYMPHS ABS: 3.2 10*3/uL (ref 0.7–4.0)
LYMPHS PCT: 41 %
MCH: 29.1 pg (ref 26.0–34.0)
MCHC: 34.5 g/dL (ref 30.0–36.0)
MCV: 84.5 fL (ref 78.0–100.0)
Monocytes Absolute: 0.5 10*3/uL (ref 0.1–1.0)
Monocytes Relative: 6 %
NEUTROS PCT: 51 %
Neutro Abs: 4.1 10*3/uL (ref 1.7–7.7)
Platelets: 306 10*3/uL (ref 150–400)
RBC: 4.84 MIL/uL (ref 3.87–5.11)
RDW: 13.9 % (ref 11.5–15.5)
WBC: 7.9 10*3/uL (ref 4.0–10.5)

## 2017-01-03 MED ORDER — IOPAMIDOL (ISOVUE-300) INJECTION 61%
INTRAVENOUS | Status: AC
  Administered 2017-01-03: 100 mL via INTRAVENOUS
  Filled 2017-01-03: qty 100

## 2017-01-03 NOTE — ED Notes (Signed)
Patient transported to CT 

## 2017-01-03 NOTE — ED Provider Notes (Signed)
[facility] Provider Note   CSN: [id number] Arrival date & time: 01/03/17  1258     History   Chief Complaint Chief Complaint  Patient presents with  . Flank Pain    HPI Janalee D Scherrie NovemberMejia Cruz is a 46 y.o. female.  HPI Patient has had lower abdominal pain for the last 2 weeks.  She has taken Tetraxyl without relief pain is worse with urination.. No diarrhea constipation.  No fevers.  Has had previous hysterectomy.  Some mild relief with naproxen.  Continues to have pain however.  No vaginal bleeding or discharge.  The pain is in the suprapubic to lower abdominal area.  Pain also radiates to her lower back and bilateral flank area.  Previous hysterectomy because her uterus "dropped". History reviewed. No pertinent past medical history.  Patient Active Problem List   Diagnosis Date Noted  . URI (upper respiratory infection) 04/04/2015  . Age spots 04/04/2015  . Other and unspecified hyperlipidemia 02/28/2013  . Environmental allergies 01/17/2013    Past Surgical History:  Procedure Laterality Date  . ABDOMINAL HYSTERECTOMY  2013    uterus fell   . CESAREAN SECTION      OB History    Gravida Para Term Preterm AB Living   0 0 0 0 0     SAB TAB Ectopic Multiple Live Births   0 0 0           Home Medications    Prior to Admission medications   Medication Sig Start Date End Date Taking? Authorizing Provider  azithromycin (ZITHROMAX) 250 MG tablet Take 1 tablet (250 mg total) by mouth daily. Take first 2 tablets together, then 1 every day until finished. Patient not taking: Reported on 10/18/2016 08/18/16   Janne NapoleonNeese, Hope M, NP  cetirizine (ZYRTEC) 10 MG tablet Take 1 tablet (10 mg total) by mouth daily. 10/18/16   Loletta SpecterGomez, Roger David, PA-C  fluticasone [facility](FLONASE) 50 MCG/ACT nasal spray Place 2 sprays into both nostrils daily. 10/18/16   Loletta SpecterGomez, Roger David, PA-C  hydroquinone 4 % cream Apply topically 2 (two) times daily. 10/18/16   Loletta SpecterGomez, Roger  David, PA-C    Family History Family History  Problem Relation Age of Onset  . Cancer Mother        utetrine cancer   . Cancer Brother     Social History Social History   Tobacco Use  . Smoking status: Never Smoker  . Smokeless tobacco: Never Used  Substance Use Topics  . Alcohol use: No  . Drug use: No     Allergies   Tessalon [benzonatate]   Review of Systems Review of Systems  Constitutional: Negative for appetite change, chills and fever.  HENT: Negative for congestion.   Respiratory: Negative for shortness of breath.   Cardiovascular: Negative for chest pain.  Gastrointestinal: Positive for abdominal pain.  Genitourinary: Positive for dysuria.  Musculoskeletal: Positive for back pain.  Neurological: Negative for numbness.  Hematological: Negative for adenopathy.  Psychiatric/Behavioral: Negative for confusion.     Physical Exam Updated Vital Signs BP 129/67 (BP Location: Right Arm)   Pulse 90   Temp 98.1 F (36.7 C) (Oral)   Resp 16   Ht 5' (1.524 m)   Wt 79.4 kg (175 lb)   SpO2 98%   BMI 34.18 kg/m   Physical Exam  Constitutional: She appears well-developed.  HENT:  Head: Atraumatic.  Eyes: Pupils are equal, round, and reactive to light.  Neck: Neck supple.  Cardiovascular: Normal rate.  Pulmonary/Chest: Effort normal.  Abdominal: There is tenderness.  Suprapubic to lower abdominal tenderness.  No rebound or guarding.  No hernias palpated.  Genitourinary:  Genitourinary Comments: No CVA tenderness  Musculoskeletal: She exhibits no edema.  Neurological: She is alert.  Skin: Skin is warm. Capillary refill takes less than 2 seconds.  Psychiatric: She has a normal mood and affect.     ED Treatments / Results  Labs (all labs ordered are listed, but only abnormal results are displayed) Labs Reviewed  URINALYSIS, ROUTINE W REFLEX MICROSCOPIC - Abnormal; Notable for the following components:      Result Value   Color, Urine STRAW (*)     All other components within normal limits  CBC WITH DIFFERENTIAL/PLATELET  I-STAT CHEM 8, ED    EKG  EKG Interpretation None       Radiology No results found.  Procedures Procedures (including critical care time)  Medications Ordered in ED Medications - No data to display   Initial Impression / Assessment and Plan / ED Course  I have reviewed the triage vital signs and the nursing notes.  Pertinent labs & imaging results that were available during my care of the patient were reviewed by me and considered in my medical decision making (see chart for details).     Patient with lower abdominal pain.  Dysuria.  Previous hysterectomy.  No relief with an oral supplement.  Urine does not show infection.  Will get CT scan and lab work.  Care turned over to Dr. Juleen ChinaKohut. Final Clinical Impressions(s) / ED Diagnoses   Final diagnoses:  Lower abdominal pain    ED Discharge Orders    None       Benjiman CorePickering, Jordanna Hendrie, MD 01/03/17 1517

## 2017-01-03 NOTE — ED Triage Notes (Signed)
Per Pt, Pt is coming from home with complaints of bilateral back pain that radiates around to her abdomen. Pt reports some pressure when she tries to urinate, but has not been able to urinate while. Reports this has been happening for two weeks. OTC medication is not helping.

## 2017-01-03 NOTE — ED Notes (Signed)
Patient given discharge instructions and verbalized understanding.  Patient stable to discharge at this time.  Patient is alert and oriented to baseline.  No distressed noted at this time.  All belongings taken with the patient at discharge.   

## 2017-10-26 ENCOUNTER — Encounter (HOSPITAL_COMMUNITY): Payer: Self-pay | Admitting: Emergency Medicine

## 2017-10-26 ENCOUNTER — Emergency Department (HOSPITAL_COMMUNITY): Payer: Self-pay

## 2017-10-26 ENCOUNTER — Other Ambulatory Visit: Payer: Self-pay

## 2017-10-26 ENCOUNTER — Emergency Department (HOSPITAL_COMMUNITY)
Admission: EM | Admit: 2017-10-26 | Discharge: 2017-10-27 | Disposition: A | Discharge status: Home / Self Care | Payer: Self-pay | Attending: Emergency Medicine | Admitting: Emergency Medicine

## 2017-10-26 DIAGNOSIS — R0789 Other chest pain: Secondary | ICD-10-CM | POA: Insufficient documentation

## 2017-10-26 DIAGNOSIS — Z79899 Other long term (current) drug therapy: Secondary | ICD-10-CM | POA: Insufficient documentation

## 2017-10-26 LAB — I-STAT TROPONIN, ED: TROPONIN I, POC: 0 ng/mL (ref 0.00–0.08)

## 2017-10-26 NOTE — ED Triage Notes (Signed)
Pt reports central 4/10 cp that radiates to her back that has been going on for the last week. Pt denies any other sx. Pt has taken ibuprofen with some relief.

## 2017-10-27 LAB — CBC
HEMATOCRIT: 40.3 % (ref 36.0–46.0)
Hemoglobin: 13.1 g/dL (ref 12.0–15.0)
MCH: 27.9 pg (ref 26.0–34.0)
MCHC: 32.5 g/dL (ref 30.0–36.0)
MCV: 85.9 fL (ref 78.0–100.0)
Platelets: 338 10*3/uL (ref 150–400)
RBC: 4.69 MIL/uL (ref 3.87–5.11)
RDW: 14.4 % (ref 11.5–15.5)
WBC: 7.3 10*3/uL (ref 4.0–10.5)

## 2017-10-27 LAB — BASIC METABOLIC PANEL
Anion gap: 8 (ref 5–15)
BUN: 14 mg/dL (ref 6–20)
CHLORIDE: 107 mmol/L (ref 98–111)
CO2: 24 mmol/L (ref 22–32)
Calcium: 9.4 mg/dL (ref 8.9–10.3)
Creatinine, Ser: 0.78 mg/dL (ref 0.44–1.00)
GFR calc Af Amer: >60 mL/min (ref >60)
GFR calc non Af Amer: >60 mL/min (ref >60)
Glucose, Bld: 90 mg/dL (ref 70–99)
POTASSIUM: 4.1 mmol/L (ref 3.5–5.1)
SODIUM: 139 mmol/L (ref 135–145)

## 2017-10-27 MED ORDER — IBUPROFEN 200 MG PO TABS
600.0000 mg | ORAL_TABLET | Freq: Once | ORAL | Status: AC
  Administered 2017-10-27: 02:00:00 600 mg via ORAL
  Filled 2017-10-27: qty 1

## 2017-10-27 NOTE — ED Notes (Signed)
Patient verbalizes understanding of medications and discharge instructions. No further questions at this time. VSS and patient ambulatory at discharge.   

## 2017-10-27 NOTE — ED Provider Notes (Signed)
[facility] EMERGENCY DEPARTMENT Provider Note  CSN: [id number] Arrival date & time: 10/26/17 2247  Chief Complaint(s) Chest Pain  HPI Shelby Mccann is a 47 y.o. female   The history is provided by the patient.  Chest Pain   This is a new problem. Episode onset: 1 week. The problem occurs constantly. The problem has not changed since onset.The pain is associated with movement and rest. Pain location: left parasternal (point) The pain is moderate. The quality of the pain is described as stabbing. Radiates to: right upper back. The symptoms are aggravated by certain positions. Pertinent negatives include no cough, no diaphoresis, no exertional chest pressure, no fever, no headaches, no irregular heartbeat, no leg pain, no lower extremity edema, no malaise/fatigue, no nausea, no near-syncope, no shortness of breath and no vomiting. Treatments tried: NSAIDs. The treatment provided significant relief. There are no known risk factors.  Pertinent negatives for past medical history include no CAD, no diabetes, no DVT, no hyperlipidemia, no hypertension, no MI, no PE, no strokes and no TIA.  Pertinent negatives for family medical history include: no early MI.    Past Medical History History reviewed. No pertinent past medical history. Patient Active Problem List   Diagnosis Date Noted  . URI (upper respiratory infection) 04/04/2015  . Age spots 04/04/2015  . Other and unspecified hyperlipidemia 02/28/2013  . Environmental allergies 01/17/2013   Home Medication(s) Prior to Admission medications   Medication Sig Start Date End Date Taking? Authorizing Provider  cetirizine (ZYRTEC) 10 MG tablet Take 1 tablet (10 mg total) by mouth daily. Patient taking differently: Take 10 mg by mouth daily as needed for allergies.  10/18/16   Loletta Specter, PA-C  fluticasone [facility]) 50 MCG/ACT nasal spray Place 2 sprays into both nostrils daily. Patient taking differently: Place 2  sprays into both nostrils daily as needed for allergies (congestion).  10/18/16   Loletta Specter, PA-C  hydroquinone 4 % cream Apply topically 2 (two) times daily. Patient taking differently: Apply 1 application topically at bedtime.  10/18/16   Loletta Specter, PA-C  Multiple Vitamin (MULTIVITAMIN WITH MINERALS) TABS tablet Take 1 tablet by mouth daily.    [provider]  naproxen sodium (ALEVE) 220 MG tablet Take 440 mg by mouth 2 (two) times daily as needed (pain).    [provider]                                                                                                                                    Past Surgical History Past Surgical History:  Procedure Laterality Date  . ABDOMINAL HYSTERECTOMY  2013    uterus fell   . CESAREAN SECTION     Family History Family History  Problem Relation Age of Onset  . Cancer Mother        utetrine cancer   . Cancer Brother     Social  History Social History   Tobacco Use  . Smoking status: Never Smoker  . Smokeless tobacco: Never Used  Substance Use Topics  . Alcohol use: No  . Drug use: No   Allergies Tessalon [benzonatate]  Review of Systems Review of Systems  Constitutional: Negative for diaphoresis, fever and malaise/fatigue.  Respiratory: Negative for cough and shortness of breath.   Cardiovascular: Positive for chest pain. Negative for near-syncope.  Gastrointestinal: Negative for nausea and vomiting.  Neurological: Negative for headaches.   All other systems are reviewed and are negative for acute change except as noted in the HPI  Physical Exam Vital Signs  I have reviewed the triage vital signs BP 114/62   Pulse 64   Temp 98.2 F (36.8 C) (Oral)   Resp (!) 21   Ht 5' (1.524 m)   Wt 76.2 kg   SpO2 99%   BMI 32.81 kg/m   Physical Exam  Constitutional: She is oriented to person, place, and time. She appears well-developed and well-nourished. No distress.  HENT:  Head:  Normocephalic and atraumatic.  Nose: Nose normal.  Eyes: Pupils are equal, round, and reactive to light. Conjunctivae and EOM are normal. Right eye exhibits no discharge. Left eye exhibits no discharge. No scleral icterus.  Neck: Normal range of motion. Neck supple.  Cardiovascular: Normal rate and regular rhythm. Exam reveals no gallop and no friction rub.  No murmur heard. Pulmonary/Chest: Effort normal and breath sounds normal. No stridor. No respiratory distress. She has no rales. She exhibits tenderness (point).    Abdominal: Soft. She exhibits no distension. There is no tenderness.  Musculoskeletal: She exhibits no edema or tenderness.  Neurological: She is alert and oriented to person, place, and time.  Skin: Skin is warm and dry. No rash noted. She is not diaphoretic. No erythema.  Psychiatric: She has a normal mood and affect.  Vitals reviewed.   ED Results and Treatments Labs (all labs ordered are listed, but only abnormal results are displayed) Labs Reviewed  BASIC METABOLIC PANEL  CBC  I-STAT TROPONIN, ED                                                                                                                         EKG  EKG Interpretation  Date/Time:  Thursday October 27 2017 01:58:29 EDT Ventricular Rate:  67 PR Interval:    QRS Duration: 89 QT Interval:  388 QTC Calculation: 410 R Axis:   12 Text Interpretation:  Sinus rhythm Low voltage, precordial leads No significant change since last tracing Confirmed by Drema Pry [phone]) on 10/27/2017 2:02:15 AM      Radiology Dg Chest 2 View  Result Date: 10/26/2017 CLINICAL DATA:  Chest pain x1 week EXAM: CHEST - 2 VIEW COMPARISON:  08/18/2016 FINDINGS: Lungs are clear.  No pleural effusion or pneumothorax. The heart is normal in size. Visualized osseous structures are within normal limits. IMPRESSION: Normal chest radiographs. Electronically Signed   By: Roselie Awkward.D.  On: 10/26/2017 23:20    Pertinent labs & imaging results that were available during my care of the patient were reviewed by me and considered in my medical decision making (see chart for details).  Medications Ordered in ED Medications  ibuprofen (ADVIL,MOTRIN) tablet 600 mg (600 mg Oral Given 10/27/17 0159)                                                                                                                                    Procedures Procedures  (including critical care time)  Medical Decision Making / ED Course I have reviewed the nursing notes for this encounter and the patient's prior records (if available in EHR or on provided paperwork).    1 week of constant substernal chest pain reproducible with point tenderness.  Highly inconsistent with ACS.  EKG without acute ischemic changes or evidence of pericarditis.  Troponin drawn in triage negative.  Given the duration of her symptoms feel this is sufficient to rule out ACS.  Low pretest probability for pulmonary embolism and PERC negative.   Chest x-ray without evidence suggestive of pneumonia, pneumothorax, pneumomediastinum.  No abnormal contour of the mediastinum to suggest dissection. No evidence of acute injuries.  Not classic for aortic dissection or esophageal perforation.  Most consistent with chest wall pain either from costochondritis or pulled muscle.  The patient appears reasonably screened and/or stabilized for discharge and I doubt any other medical condition or other [facility] requiring further screening, evaluation, or treatment in the ED at this time prior to discharge.  The patient is safe for discharge with strict return precautions.   Final Clinical Impression(s) / ED Diagnoses Final diagnoses:  None    Disposition: Discharge  Condition: Good  I have discussed the results, Dx and Tx plan with the patient who expressed understanding and agree(s) with the plan. Discharge instructions discussed at great length. The patient  was given strict return precautions who verbalized understanding of the instructions. No further questions at time of discharge.    ED Discharge Orders    None       Follow Up: Doctor Gertie Baron   If you do not have a primary care physician, contact [facility] at [phone] for referral     This chart was dictated using voice recognition software.  Despite best efforts to proofread,  errors can occur which can change the documentation meaning.   Nira Conn, MD 10/27/17 [phone]

## 2017-11-02 ENCOUNTER — Encounter: Payer: Self-pay | Admitting: Critical Care Medicine

## 2017-11-02 ENCOUNTER — Ambulatory Visit: Payer: Self-pay | Attending: Critical Care Medicine | Admitting: Critical Care Medicine

## 2017-11-02 VITALS — BP 119/78 | HR 69 | Temp 98.0°F | Resp 16 | Ht 60.0 in | Wt 171.6 lb

## 2017-11-02 DIAGNOSIS — L814 Other melanin hyperpigmentation: Secondary | ICD-10-CM

## 2017-11-02 DIAGNOSIS — M940 Chondrocostal junction syndrome [Tietze]: Secondary | ICD-10-CM | POA: Insufficient documentation

## 2017-11-02 DIAGNOSIS — Z9109 Other allergy status, other than to drugs and biological substances: Secondary | ICD-10-CM

## 2017-11-02 DIAGNOSIS — J302 Other seasonal allergic rhinitis: Secondary | ICD-10-CM | POA: Insufficient documentation

## 2017-11-02 MED ORDER — FLUTICASONE PROPIONATE 50 MCG/ACT NA SUSP: 2.0000 | Freq: Every day | NASAL | 6 refills | Status: DC

## 2017-11-02 MED ORDER — CETIRIZINE HCL 10 MG PO TABS: 10.0000 mg | ORAL_TABLET | Freq: Every day | ORAL | 11 refills | Status: DC

## 2017-11-02 MED ORDER — INDOMETHACIN 50 MG PO CAPS: 50.0000 mg | ORAL_CAPSULE | Freq: Three times a day (TID) | ORAL | 0 refills | Status: DC

## 2017-11-02 MED FILL — INDOMETHACIN 50 MG CAPSULE: 50 | 10 days supply | Qty: 30 | Fill #0

## 2017-11-02 MED FILL — FLUTICASONE PROP 50 MCG SPR: 50 | 30 days supply | Qty: 16 | Fill #0

## 2017-11-02 NOTE — Progress Notes (Signed)
Pt. Stated she do not have any breathing problem, just chest pain.

## 2017-11-02 NOTE — Assessment & Plan Note (Signed)
The patient has significant seasonal allergies and did request refills on the nasal steroid and antihistamine and I did comply with that and refills were sent on Zyrtec and Flonase to the pharmacy

## 2017-11-02 NOTE — Patient Instructions (Addendum)
You have costochondritis which is inflammation of the cartilage around your breast bone You will receive indomethacin 50 mg to take 3 times daily with meals for 10 days You  are advised to take some Tums or over-the-counter Pepcid daily while on the indomethacin and to use the indomethacin with meals A return visit in 1 week will be scheduled We will let your primary care physician know about your questions around the skin medication you are using  Costocondritis Costochondritis La costocondritis es la hinchazn e irritacin (inflamacin) del tejido Building surveyor) que une las costillas con el esternn. Esto causa dolor en la parte frontal del pecho. Por lo general, el dolor tiene las siguientes caractersticas:  Comienza de forma gradual.  Se manifiesta en ms de [employer]  Generalmente, esta afeccin desaparece con el paso tiempo, sin tratamiento. Siga estas indicaciones en su casa:  No haga nada que intensifique el dolor.  Si se lo indican, aplique hielo sobre la zona del dolor: ? Nature conservation officer hielo en una bolsa plstica. ? Coloque una [employer] piel y la bolsa de hielo. ? Coloque el hielo durante , 2 o 3veces por da.  Si se lo indican, aplique calor en la zona afectada con la frecuencia que le indique el mdico. Use la fuente de calor que el mdico le indique, por ejemplo, una compresa de calor hmedo o una almohadilla trmica. ? Colquese una [employer] piel y la fuente de Airline pilot. ? Aplique el calor durante 20 a . ? Retire la fuente de calor si la piel se le pone de color rojo brillante. Esto es muy importante si no puede sentir el dolor, el calor o el fro. Puede correr un riesgo mayor de sufrir quemaduras.  Tome los medicamentos de venta libre y los recetados solamente como se lo haya indicado el mdico.  Retome sus actividades habituales como se lo haya indicado el mdico. Pregntele al mdico qu actividades son seguras para usted.  Concurra a  todas las visitas de control como se lo haya indicado el mdico. Esto es importante. Comunquese con un mdico si:  Tiene escalofros o fiebre.  El dolor persiste o [place].  Tiene tos que no desaparece. Solicite ayuda de inmediato si:  Le falta el aire. Esta informacin no tiene Theme park manager el consejo del mdico. Asegrese de hacerle al mdico cualquier pregunta que tenga. Document Released: 02/13/2010 Document Revised: 04/15/2016 Document Reviewed: 05/07/2015 Elsevier Interactive Patient Education  [employer].

## 2017-11-02 NOTE — Progress Notes (Signed)
Subjective:    Patient ID: Shelby Mccann, female    DOB: [birth year], 47 y.o.   MRN: [id number]  47 y.o. [name] here for evaluation of chest pain  Chest Pain   This is a new problem. The current episode started 1 to 4 weeks ago. The onset quality is gradual. The problem occurs constantly (ibuprofen helps but pain comes back). The problem has been gradually worsening. The pain is present in the substernal region. The pain is at a severity of 5/10. The pain is moderate. The quality of the pain is described as sharp. The pain does not radiate. Associated symptoms include back pain, exertional chest pressure and orthopnea. Pertinent negatives include no cough, diaphoresis, dizziness, hemoptysis, irregular heartbeat, leg pain, lower extremity edema, malaise/fatigue, nausea, near-syncope, palpitations, PND, shortness of breath or sputum production. Associated symptoms comments: Cannot lie flat due to pain.  NSAIDS help but has to wake up in middle of night  Cooking a lot not able to stir, pain worse . The pain is aggravated by nothing. She has tried NSAIDs for the symptoms. The treatment provided mild relief. Risk factors include lack of exercise.  Pertinent negatives for past medical history include no COPD, no DVT, no hypertension, no PE, no PVD, no strokes and no thyroid problem.  The patient also has a history of allergic rhinitis and did request refills on nasal steroid and antihistamine Past Medical History:  Diagnosis Date  . Allergy      Family History  Problem Relation Age of Onset  . Cancer Mother        utetrine cancer   . Cancer Brother      Social History   Socioeconomic History  . Marital status: Married    Spouse name: Not on file  . Number of children: Not on file  . Years of education: Not on file  . Highest education level: Not on file  Occupational History  . Not on file  Social Needs  . Financial resource strain: Not on file  . Food insecurity:    Worry:  Not on file    Inability: Not on file  . Transportation needs:    Medical: Not on file    Non-medical: Not on file  Tobacco Use  . Smoking status: Never Smoker  . Smokeless tobacco: Never Used  Substance and Sexual Activity  . Alcohol use: No  . Drug use: No  . Sexual activity: Not on file  Lifestyle  . Physical activity:    Days per week: Not on file    Minutes per session: Not on file  . Stress: Not on file  Relationships  . Social connections:    Talks on phone: Not on file    Gets together: Not on file    Attends religious service: Not on file    Active member of club or organization: Not on file    Attends meetings of clubs or organizations: Not on file    Relationship status: Not on file  . Intimate partner violence:    Fear of current or ex partner: Not on file    Emotionally abused: Not on file    Physically abused: Not on file    Forced sexual activity: Not on file  Other Topics Concern  . Not on file  Social History Narrative   ** Merged History Encounter **         Allergies  Allergen Reactions  . Tessalon [Benzonatate]  Pt reported burning, itching on vaginal area     Outpatient Medications Prior to Visit  Medication Sig Dispense Refill  . hydroquinone 4 % cream Apply topically 2 (two) times daily. 28.35 g 0  . Multiple Vitamin (MULTIVITAMIN WITH MINERALS) TABS tablet Take 1 tablet by mouth daily.    . Ibuprofen 200 MG CAPS Take 600 mg by mouth 4 (four) times daily.     . cetirizine (ZYRTEC) 10 MG tablet Take 1 tablet (10 mg total) by mouth daily. (Patient not taking: Reported on 11/02/2017) 30 tablet 11  . fluticasone (FLONASE) 50 MCG/ACT nasal spray Place 2 sprays into both nostrils daily. (Patient not taking: Reported on 11/02/2017) 16 g 6  . naproxen sodium (ALEVE) 220 MG tablet Take 440 mg by mouth 2 (two) times daily as needed (pain).     No facility-administered medications prior to visit.       Review of Systems  Constitutional: Negative  for diaphoresis and malaise/fatigue.  Respiratory: Negative for cough, hemoptysis, sputum production and shortness of breath.   Cardiovascular: Positive for chest pain and orthopnea. Negative for palpitations, PND and near-syncope.  Gastrointestinal: Negative for nausea.  Musculoskeletal: Positive for back pain.  Neurological: Negative for dizziness.       Objective:   Physical Exam Vitals:   11/02/17 0844  BP: 119/78  Pulse: 69  Resp: 16  Temp: 98 F (36.7 C)  TempSrc: Oral  SpO2: 97%  Weight: 171 lb 9.6 oz (77.8 kg)  Height: 5' (1.524 m)    Gen: Pleasant, well-nourished, in no distress,  normal affect  ENT: No lesions,  mouth clear,  oropharynx clear, no postnasal drip  Neck: No JVD, no TMG, no carotid bruits  Lungs: No use of accessory muscles, no dullness to percussion, clear without rales or rhonchi  Cardiovascular: RRR, heart sounds normal, no murmur or gallops, no peripheral edema  Abdomen: soft and NT, no HSM,  BS normal  Musculoskeletal: No deformities, no cyanosis or clubbing, there is point tenderness left greater than right parasternal areas consistent with costochondritis   Neuro: alert, non focal  Skin: Warm, no lesions or rashes  No results found.  All laboratory data and chest x-rays EKGs are reviewed from the recent emergency room visit and were unremarkable      Assessment & Plan:  I personally reviewed all images and lab data in the [facility] system as well as any outside material available during this office visit and agree with the  radiology impressions.   Costochondritis, acute The patient's constellation of symptoms and physical exam is most consistent with costochondritis There is no evidence of acute cardiopulmonary disease Cardiac ischemia has been ruled out The chest x-ray does not show evidence of aneurysmal dilatation or other acute process A CT scan of the chest is not likely to add additional information Plan Will administer Indocin  50 mg 3 times daily for 10 days The patient will also use Tums or Pepcid for GI protection while on Indocin Patient will return in 1 week for reassessment No additional studies are indicated   Environmental allergies The patient has significant seasonal allergies and did request refills on the nasal steroid and antihistamine and I did comply with that and refills were sent on Zyrtec and Flonase to the pharmacy  Age spots The patient had questions on the dosing of the steroid cream for her facial rash and I did refer her back to her primary care provider for this and sent a message in  the [facility] system   Lore was seen today for hospitalization follow-up.  Diagnoses and all orders for this visit:  Costochondritis, acute  Environmental allergies -     fluticasone (FLONASE) 50 MCG/ACT nasal spray; Place 2 sprays into both nostrils daily. -     cetirizine (ZYRTEC) 10 MG tablet; Take 1 tablet (10 mg total) by mouth daily.  Age spots  Other orders -     indomethacin (INDOCIN) 50 MG capsule; Take 1 capsule (50 mg total) by mouth 3 (three) times daily with meals for 10 days.

## 2017-11-02 NOTE — Assessment & Plan Note (Signed)
The patient's constellation of symptoms and physical exam is most consistent with costochondritis There is no evidence of acute cardiopulmonary disease Cardiac ischemia has been ruled out The chest x-ray does not show evidence of aneurysmal dilatation or other acute process A CT scan of the chest is not likely to add additional information Plan Will administer Indocin 50 mg 3 times daily for 10 days The patient will also use Tums or Pepcid for GI protection while on Indocin Patient will return in 1 week for reassessment No additional studies are indicated

## 2017-11-02 NOTE — Assessment & Plan Note (Signed)
The patient had questions on the dosing of the steroid cream for her facial rash and I did refer her back to her primary care provider for this and sent a message in the [facility] system

## 2017-11-03 ENCOUNTER — Telehealth (INDEPENDENT_AMBULATORY_CARE_PROVIDER_SITE_OTHER): Payer: Self-pay | Admitting: Physician Assistant

## 2017-11-03 NOTE — Telephone Encounter (Signed)
Sorry, 4% is the highest dose. She is to discontinue if ineffective after 2 -3 month of use. Dermatologist would be the next step.

## 2017-11-03 NOTE — Telephone Encounter (Signed)
Patient concern with medication hydroquinone 4 % cream Was that she was requesting a refill. Patient also stated that the cream only makes the spots lighter but does not take them away. Patient would also like to know if PCP could increase dosage.  Please follow up [phone]  Thank you Louisa Second

## 2017-11-03 NOTE — Telephone Encounter (Signed)
Patient is aware and would like to be referred to a dermatologist. Patient was also advice that she would need to apply for OC in order to be referred. Patient stated she would let me know when she has been approved for the OC.

## 2017-11-06 ENCOUNTER — Emergency Department (HOSPITAL_COMMUNITY)
Admission: EM | Admit: 2017-11-06 | Discharge: 2017-11-07 | Disposition: A | Discharge status: Home / Self Care | Payer: Self-pay | Attending: Emergency Medicine | Admitting: Emergency Medicine

## 2017-11-06 ENCOUNTER — Encounter (HOSPITAL_COMMUNITY): Payer: Self-pay | Admitting: Emergency Medicine

## 2017-11-06 DIAGNOSIS — M940 Chondrocostal junction syndrome [Tietze]: Secondary | ICD-10-CM | POA: Insufficient documentation

## 2017-11-06 DIAGNOSIS — Z79899 Other long term (current) drug therapy: Secondary | ICD-10-CM | POA: Insufficient documentation

## 2017-11-06 LAB — I-STAT CHEM 8, ED
BUN: 24 mg/dL — ABNORMAL HIGH (ref 6–20)
CALCIUM ION: 1.21 mmol/L (ref 1.15–1.40)
CHLORIDE: 103 mmol/L (ref 98–111)
Creatinine, Ser: 0.8 mg/dL (ref 0.44–1.00)
Glucose, Bld: 100 mg/dL — ABNORMAL HIGH (ref 70–99)
HCT: 42 % (ref 36.0–46.0)
HEMOGLOBIN: 14.3 g/dL (ref 12.0–15.0)
Potassium: 4.1 mmol/L (ref 3.5–5.1)
SODIUM: 139 mmol/L (ref 135–145)
TCO2: 29 mmol/L (ref 22–32)

## 2017-11-06 LAB — CBC
HCT: 42.1 % (ref 36.0–46.0)
Hemoglobin: 13.9 g/dL (ref 12.0–15.0)
MCH: 27.7 pg (ref 26.0–34.0)
MCHC: 33 g/dL (ref 30.0–36.0)
MCV: 83.9 fL (ref 80.0–100.0)
NRBC: 0 % (ref 0.0–0.2)
PLATELETS: 375 10*3/uL (ref 150–400)
RBC: 5.02 MIL/uL (ref 3.87–5.11)
RDW: 13.9 % (ref 11.5–15.5)
WBC: 9.8 10*3/uL (ref 4.0–10.5)

## 2017-11-06 LAB — I-STAT BETA HCG BLOOD, ED (MC, WL, AP ONLY): I-stat hCG, quantitative: <5 m[IU]/mL (ref <5)

## 2017-11-06 LAB — I-STAT TROPONIN, ED: Troponin i, poc: 0 ng/mL (ref 0.00–0.08)

## 2017-11-06 MED ORDER — LIDOCAINE 5 % EX PTCH
1.0000 | MEDICATED_PATCH | CUTANEOUS | Status: DC
  Administered 2017-11-06: 1 via TRANSDERMAL
  Filled 2017-11-06: qty 1

## 2017-11-06 MED ORDER — KETOROLAC TROMETHAMINE 30 MG/ML IJ SOLN
30.0000 mg | Freq: Once | INTRAMUSCULAR | Status: AC
  Administered 2017-11-06: 30 mg via INTRAVENOUS
  Filled 2017-11-06: qty 1

## 2017-11-06 NOTE — ED Provider Notes (Signed)
[facility] EMERGENCY DEPARTMENT Provider Note   CSN: [id number] Arrival date & time: 11/06/17  2317     History   Chief Complaint Chief Complaint  Patient presents with  . Chest Pain    HPI Shelby Mccann is a 47 y.o. female.  The history is provided by the patient.  Chest Pain   This is a new problem. The current episode started more than 1 week ago (4 or more weeks). The problem occurs constantly. The problem has not changed since onset.The pain is associated with rest. The pain is present in the substernal region (just left of sternum). The pain is moderate. The pain does not radiate. Duration of episode(s) is 4 weeks. Pertinent negatives include no abdominal pain, no diaphoresis, no fever, no irregular heartbeat, no malaise/fatigue, no palpitations, no shortness of breath, no sputum production, no syncope, no vomiting and no weakness. Treatments tried: NSAIDS. The treatment provided no relief. Risk factors include obesity.  Pertinent negatives for past medical history include no MI.  Pertinent negatives for family medical history include: no Marfan's syndrome.  Procedure history is negative for cardiac catheterization.    Past Medical History:  Diagnosis Date  . Allergy     Patient Active Problem List   Diagnosis Date Noted  . Costochondritis, acute 11/02/2017  . Age spots 04/04/2015  . Other and unspecified hyperlipidemia 02/28/2013  . Environmental allergies 01/17/2013    Past Surgical History:  Procedure Laterality Date  . ABDOMINAL HYSTERECTOMY  2013    uterus fell   . CESAREAN SECTION       OB History    Gravida  0   Para  0   Term  0   Preterm  0   AB  0   Living        SAB  0   TAB  0   Ectopic  0   Multiple      Live Births               Home Medications    Prior to Admission medications   Medication Sig Start Date End Date Taking? Authorizing Provider  cetirizine (ZYRTEC) 10 MG tablet Take 1 tablet  (10 mg total) by mouth daily. 11/02/17   Storm Frisk, MD  fluticasone (FLONASE) 50 MCG/ACT nasal spray Place 2 sprays into both nostrils daily. 11/02/17   Storm Frisk, MD  hydroquinone 4 % cream Apply topically 2 (two) times daily. 10/18/16   Loletta Specter, PA-C  indomethacin (INDOCIN) 50 MG capsule Take 1 capsule (50 mg total) by mouth 3 (three) times daily with meals for 10 days. 11/02/17 11/12/17  Storm Frisk, MD  Multiple Vitamin (MULTIVITAMIN WITH MINERALS) TABS tablet Take 1 tablet by mouth daily.    [provider]    Family History Family History  Problem Relation Age of Onset  . Cancer Mother        utetrine cancer   . Cancer Brother     Social History Social History   Tobacco Use  . Smoking status: Never Smoker  . Smokeless tobacco: Never Used  Substance Use Topics  . Alcohol use: No  . Drug use: No     Allergies   Tessalon [benzonatate]   Review of Systems Review of Systems  Constitutional: Negative for diaphoresis, fever and malaise/fatigue.  Respiratory: Negative for sputum production and shortness of breath.   Cardiovascular: Positive for chest pain. Negative for palpitations, leg swelling and  syncope.  Gastrointestinal: Negative for abdominal pain and vomiting.  Neurological: Negative for weakness.  All other systems reviewed and are negative.    Physical Exam Updated Vital Signs BP 137/78 (BP Location: Left Arm)   Pulse 75   Temp 98.5 F (36.9 C) (Oral)   Resp 16   Wt 73.5 kg   SpO2 100%   BMI 31.64 kg/m   Physical Exam  Constitutional: She is oriented to person, place, and time. She appears well-developed and well-nourished. No distress.  HENT:  Head: Normocephalic and atraumatic.  Mouth/Throat: No oropharyngeal exudate.  Eyes: Pupils are equal, round, and reactive to light. Conjunctivae are normal.  Neck: Normal range of motion. Neck supple.  Cardiovascular: Normal rate, regular rhythm, normal heart sounds and  intact distal pulses.  Pulmonary/Chest: Effort normal and breath sounds normal. No stridor. No respiratory distress. She has no wheezes. She has no rales. She exhibits tenderness.    Abdominal: Soft. Bowel sounds are normal. She exhibits no mass. There is no tenderness. There is no rebound and no guarding.  Musculoskeletal: Normal range of motion. She exhibits no tenderness.  Neurological: She is alert and oriented to person, place, and time. She displays normal reflexes.  Skin: Skin is warm and dry. Capillary refill takes less than 2 seconds.  Psychiatric: She has a normal mood and affect.     ED Treatments / Results  Labs (all labs ordered are listed, but only abnormal results are displayed) Results for orders placed or performed during the hospital encounter of 11/06/17  CBC  Result Value Ref Range   WBC 9.8 4.0 - 10.5 K/uL   RBC 5.02 3.87 - 5.11 MIL/uL   Hemoglobin 13.9 12.0 - 15.0 g/dL   HCT 16.1 09.6 - 04.5 %   MCV 83.9 80.0 - 100.0 fL   MCH 27.7 26.0 - 34.0 pg   MCHC 33.0 30.0 - 36.0 g/dL   RDW 40.9 81.1 - 91.4 %   Platelets 375 150 - 400 K/uL   nRBC 0.0 0.0 - 0.2 %  I-stat troponin, ED  Result Value Ref Range   Troponin i, poc 0.00 0.00 - 0.08 ng/mL   Comment 3          I-Stat beta hCG blood, ED  Result Value Ref Range   I-stat hCG, quantitative <5.0 <5 mIU/mL   Comment 3          I-Stat Chem 8, ED  Result Value Ref Range   Sodium 139 135 - 145 mmol/L   Potassium 4.1 3.5 - 5.1 mmol/L   Chloride 103 98 - 111 mmol/L   BUN 24 (H) 6 - 20 mg/dL   Creatinine, Ser 7.82 0.44 - 1.00 mg/dL   Glucose, Bld 956 (H) 70 - 99 mg/dL   Calcium, Ion 2.13 0.86 - 1.40 mmol/L   TCO2 29 22 - 32 mmol/L   Hemoglobin 14.3 12.0 - 15.0 g/dL   HCT 57.8 46.9 - 62.9 %   Dg Chest 2 View  Result Date: 10/26/2017 CLINICAL DATA:  Chest pain x1 week EXAM: CHEST - 2 VIEW COMPARISON:  08/18/2016 FINDINGS: Lungs are clear.  No pleural effusion or pneumothorax. The heart is normal in size.  Visualized osseous structures are within normal limits. IMPRESSION: Normal chest radiographs. Electronically Signed   By: Charline Bills M.D.   On: 10/26/2017 23:20    EKG EKG Interpretation  Date/Time:  Sunday November 06 2017 23:20:57 EDT Ventricular Rate:  75 PR Interval:  146 QRS Duration:  74 QT Interval:  364 QTC Calculation: 406 R Axis:   35 Text Interpretation:  Normal sinus rhythm Confirmed by Nicanor Alcon, Sherley Leser ([id number]) on 11/06/2017 11:31:20 PM   Radiology No results found.  Procedures Procedures (including critical care time)  Medications Ordered in ED Medications  lidocaine (LIDODERM) 5 % 1 patch (has no administration in time range)  ketorolac (TORADOL) 30 MG/ML injection 30 mg (has no administration in time range)      Symptoms consistent costochondritis.  Symptoms are inconsistent with ACS, ruled out with one troponin and a normal EKG in the ED.  HEART score is 1 very low risk for MACE.  Ruled out for PE in the ED.  Will add heat and lidoderm to current regimen.   Final Clinical Impressions(s) / ED Diagnoses   Return for weakness, numbness, changes in vision or speech, fevers >100.4 unrelieved by medication, shortness of breath, intractable vomiting, or diarrhea, abdominal pain, Inability to tolerate liquids or food, cough, altered mental status or any concerns. No signs of systemic illness or infection. The patient is nontoxic-appearing on exam and vital signs are within normal limits.    I have reviewed the triage vital signs and the nursing notes. Pertinent labs &imaging results that were available during my care of the patient were reviewed by me and considered in my medical decision making (see chart for details).  After history, exam, and medical workup I feel the patient has been appropriately medically screened and is safe for discharge home. Pertinent diagnoses were discussed with the patient. Patient was given return precautions.   Karolyn Messing,  MD 11/07/17 6045

## 2017-11-06 NOTE — ED Triage Notes (Signed)
Pt c/o central CP x 4 weeks, pt has been seen in ED as well as PCP, pain now worse and now radiates across chest. Pt states pain worse with movement. Position of comfort leaning back

## 2017-11-07 ENCOUNTER — Encounter (HOSPITAL_COMMUNITY): Payer: Self-pay | Admitting: Emergency Medicine

## 2017-11-07 ENCOUNTER — Emergency Department (HOSPITAL_COMMUNITY): Payer: Self-pay

## 2017-11-07 LAB — BASIC METABOLIC PANEL
ANION GAP: 9 (ref 5–15)
BUN: 22 mg/dL — ABNORMAL HIGH (ref 6–20)
CHLORIDE: 101 mmol/L (ref 98–111)
CO2: 25 mmol/L (ref 22–32)
Calcium: 9.5 mg/dL (ref 8.9–10.3)
Creatinine, Ser: 0.76 mg/dL (ref 0.44–1.00)
GFR calc Af Amer: >60 mL/min (ref >60)
Glucose, Bld: 105 mg/dL — ABNORMAL HIGH (ref 70–99)
POTASSIUM: 4.2 mmol/L (ref 3.5–5.1)
Sodium: 135 mmol/L (ref 135–145)

## 2017-11-07 LAB — D-DIMER, QUANTITATIVE (NOT AT ARMC): D DIMER QUANT: 0.79 ug{FEU}/mL — AB (ref 0.00–0.50)

## 2017-11-07 MED ORDER — IOPAMIDOL (ISOVUE-370) INJECTION 76%
100.0000 mL | Freq: Once | INTRAVENOUS | Status: AC | PRN
  Administered 2017-11-07: 100 mL via INTRAVENOUS

## 2017-11-07 MED ORDER — LIDOCAINE 5 % EX PTCH: 1.0000 | MEDICATED_PATCH | CUTANEOUS | 0 refills | Status: DC

## 2017-11-07 MED FILL — LIDOCAINE PATCH 5%: 5 | 10 days supply | Qty: 10 | Fill #0

## 2017-11-07 NOTE — ED Notes (Signed)
Taken to CT at this time. 

## 2017-11-09 ENCOUNTER — Encounter: Payer: Self-pay | Admitting: Critical Care Medicine

## 2017-11-09 ENCOUNTER — Ambulatory Visit: Payer: Self-pay | Attending: Critical Care Medicine | Admitting: Critical Care Medicine

## 2017-11-09 VITALS — BP 110/73 | HR 75 | Temp 98.2°F | Resp 18 | Ht 59.0 in | Wt 169.0 lb

## 2017-11-09 DIAGNOSIS — Z79899 Other long term (current) drug therapy: Secondary | ICD-10-CM | POA: Insufficient documentation

## 2017-11-09 DIAGNOSIS — M940 Chondrocostal junction syndrome [Tietze]: Secondary | ICD-10-CM | POA: Insufficient documentation

## 2017-11-09 DIAGNOSIS — Z23 Encounter for immunization: Secondary | ICD-10-CM | POA: Insufficient documentation

## 2017-11-09 MED ORDER — KETOROLAC TROMETHAMINE 10 MG PO TABS: 10.0000 mg | ORAL_TABLET | Freq: Four times a day (QID) | ORAL | 0 refills | Status: DC

## 2017-11-09 MED ORDER — PREDNISONE 10 MG PO TABS: ORAL_TABLET | ORAL | 0 refills | Status: DC

## 2017-11-09 MED FILL — KETOROLAC 10 MG TABLET: 10 | 5 days supply | Qty: 20 | Fill #0

## 2017-11-09 MED FILL — predniSONE 10 MG TABS: 10 | 16 days supply | Qty: 30 | Fill #0

## 2017-11-09 NOTE — Patient Instructions (Addendum)
Stop Indocin Start Lidocaine patch  Start Toradol one pill 4 times a day, with food Start Prednisone 10mg  Take 4 for four days 3 for four days 2 for four days 1 for four days A flu shot was given Return 11/14/17 for recheck

## 2017-11-09 NOTE — Assessment & Plan Note (Signed)
Severe acute costochondritis with poor response to Indocin therapy The patient was back in the emergency room over the past weekend Plan Pulse prednisone 40 mg daily with a taper by 10 mg every 3 days till off Discontinue Indocin Begin Toradol 10 mg orally every 6 hours for a 5-day course The patient will fill the Lidoderm patch that was prescribed in the emergency room and applied over the affected area daily Return in 1 week for recheck

## 2017-11-09 NOTE — Progress Notes (Signed)
Subjective:    Patient ID: Shelby Mccann, female    DOB: May 16, 1970, 47 y.o.   MRN: [id number]  47 y.o. [name] seen in f/u for costochondritis. Rx Indocin 50mg  TID  at last OV 11/02/17 Since last OV:    Chest Pain   This is a new problem. The current episode started 1 to 4 weeks ago. The onset quality is gradual. The problem occurs constantly (ibuprofen helps but pain comes back). The problem has been gradually worsening. The pain is present in the substernal region. The pain is at a severity of 5/10. The pain is moderate. The quality of the pain is described as sharp. The pain does not radiate. Associated symptoms include back pain, exertional chest pressure and orthopnea. Pertinent negatives include no cough, diaphoresis, dizziness, hemoptysis, irregular heartbeat, leg pain, lower extremity edema, malaise/fatigue, nausea, near-syncope, palpitations, PND, shortness of breath or sputum production. Associated symptoms comments: Cannot lie flat due to pain.  NSAIDS help but has to wake up in middle of night  Cooking a lot not able to stir, pain worse . The pain is aggravated by nothing. She has tried NSAIDs for the symptoms. The treatment provided mild relief. Risk factors include lack of exercise.  Pertinent negatives for past medical history include no COPD, no DVT, no hypertension, no PE, no PVD, no strokes and no thyroid problem.  The patient also has a history of allergic rhinitis and did request refills on nasal steroid and antihistamine Past Medical History:  Diagnosis Date  . Allergy      Family History  Problem Relation Age of Onset  . Cancer Mother        utetrine cancer   . Cancer Brother      Social History   Socioeconomic History  . Marital status: Married    Spouse name: Not on file  . Number of children: Not on file  . Years of education: Not on file  . Highest education level: Not on file  Occupational History  . Not on file  Social Needs  . Financial  resource strain: Not on file  . Food insecurity:    Worry: Not on file    Inability: Not on file  . Transportation needs:    Medical: Not on file    Non-medical: Not on file  Tobacco Use  . Smoking status: Never Smoker  . Smokeless tobacco: Never Used  Substance and Sexual Activity  . Alcohol use: No  . Drug use: No  . Sexual activity: Yes  Lifestyle  . Physical activity:    Days per week: Not on file    Minutes per session: Not on file  . Stress: Not on file  Relationships  . Social connections:    Talks on phone: Not on file    Gets together: Not on file    Attends religious service: Not on file    Active member of club or organization: Not on file    Attends meetings of clubs or organizations: Not on file    Relationship status: Not on file  . Intimate partner violence:    Fear of current or ex partner: Not on file    Emotionally abused: Not on file    Physically abused: Not on file    Forced sexual activity: Not on file  Other Topics Concern  . Not on file  Social History Narrative   ** Merged History Encounter **         Allergies  Allergen Reactions  . Tessalon [Benzonatate]     Pt reported burning, itching on vaginal area     Outpatient Medications Prior to Visit  Medication Sig Dispense Refill  . cetirizine (ZYRTEC) 10 MG tablet Take 1 tablet (10 mg total) by mouth daily. 30 tablet 11  . fluticasone (FLONASE) 50 MCG/ACT nasal spray Place 2 sprays into both nostrils daily. 16 g 6  . hydroquinone 4 % cream Apply topically 2 (two) times daily. 28.35 g 0  . lidocaine (LIDODERM) 5 % Place 1 patch onto the skin daily. Remove & Discard patch within 12 hours or as directed by MD 10 patch 0  . Multiple Vitamin (MULTIVITAMIN WITH MINERALS) TABS tablet Take 1 tablet by mouth daily.    . indomethacin (INDOCIN) 50 MG capsule Take 1 capsule (50 mg total) by mouth 3 (three) times daily with meals for 10 days. 30 capsule 0   No facility-administered medications prior to  visit.       Review of Systems  Constitutional: Negative for diaphoresis and malaise/fatigue.  Respiratory: Negative for cough, hemoptysis, sputum production and shortness of breath.   Cardiovascular: Positive for chest pain and orthopnea. Negative for palpitations, PND and near-syncope.  Gastrointestinal: Negative for nausea.  Musculoskeletal: Positive for back pain.  Neurological: Negative for dizziness.       Objective:   Physical Exam  Vitals:   11/09/17 0959  BP: 110/73  Pulse: 75  Resp: 18  Temp: 98.2 F (36.8 C)  TempSrc: Oral  SpO2: 99%  Weight: 169 lb (76.7 kg)  Height: 4\' 11"  (1.499 m)    Gen: Pleasant, well-nourished, in no distress,  normal affect  ENT: No lesions,  mouth clear,  oropharynx clear, no postnasal drip  Neck: No JVD, no TMG, no carotid bruits  Lungs: No use of accessory muscles, no dullness to percussion, clear without rales or rhonchi  Cardiovascular: RRR, heart sounds normal, no murmur or gallops, no peripheral edema  Abdomen: soft and NT, no HSM,  BS normal  Musculoskeletal: No deformities, no cyanosis or clubbing, there is point tenderness left greater than right parasternal areas consistent with costochondritis   Neuro: alert, non focal  Skin: Warm, no lesions or rashes  No results found.  All laboratory data and chest x-rays EKGs are reviewed from the recent emergency room visit and were unremarkable      Assessment & Plan:  I personally reviewed all images and lab data in the [facility] system as well as any outside material available during this office visit and agree with the  radiology impressions.   Costochondritis, acute Severe acute costochondritis with poor response to Indocin therapy The patient was back in the emergency room over the past weekend Plan Pulse prednisone 40 mg daily with a taper by 10 mg every 3 days till off Discontinue Indocin Begin Toradol 10 mg orally every 6 hours for a 5-day course The patient will  fill the Lidoderm patch that was prescribed in the emergency room and applied over the affected area daily Return in 1 week for recheck   Shaylon was seen today for follow-up.  Diagnoses and all orders for this visit:  Costochondritis, acute  Need for immunization against influenza -     Flu Vaccine QUAD 36+ mos IM  Other orders -     ketorolac (TORADOL) 10 MG tablet; Take 1 tablet (10 mg total) by mouth every 6 (six) hours for 5 days. -     predniSONE (DELTASONE) 10 MG tablet;  Take 4 for four days 3 for four days 2 for four days 1 for four days

## 2017-11-14 ENCOUNTER — Encounter: Payer: Self-pay | Admitting: Critical Care Medicine

## 2017-11-14 ENCOUNTER — Ambulatory Visit: Payer: Self-pay | Attending: Critical Care Medicine | Admitting: Critical Care Medicine

## 2017-11-14 VITALS — BP 120/76 | HR 95 | Temp 98.7°F | Resp 18 | Ht 59.0 in | Wt 180.0 lb

## 2017-11-14 DIAGNOSIS — M940 Chondrocostal junction syndrome [Tietze]: Secondary | ICD-10-CM | POA: Insufficient documentation

## 2017-11-14 MED ORDER — MELOXICAM 15 MG PO TABS: 15.0000 mg | ORAL_TABLET | Freq: Every day | ORAL | 0 refills | Status: DC

## 2017-11-14 MED ORDER — ACETAMINOPHEN 500 MG PO TABS: 500.0000 mg | ORAL_TABLET | Freq: Four times a day (QID) | ORAL | 0 refills | Status: AC

## 2017-11-14 MED FILL — MELOXICAM 15 MG TABLET: 15 | 30 days supply | Qty: 30 | Fill #0

## 2017-11-14 NOTE — Assessment & Plan Note (Signed)
Ongoing left parasternal costochondritis No other apparent etiology discerned No improvement with Toradol, Lidoderm patch, and prednisone. Note previous negative CT Angio of the chest  Plan We will discontinue Toradol Will initiate meloxicam 15 mg daily We will continue prednisone per current tapering dose We will continue Lidoderm patch to change daily The only other additional intervention would be referral to either a pain specialist orthopedics for parasternal injection of anesthetic or anti-inflammatory To be complete we will check ANA, sed rate, rheumatoid factor Return in 1 week for recheck

## 2017-11-14 NOTE — Progress Notes (Signed)
Subjective:    Patient ID: Shelby Mccann, female    DOB: Nov 13, 1970, 47 y.o.   MRN: [id number]  47 y.o. [name] seen in f/u for costochondritis. Did not improved with indocin so on last OV 10/16 we Rx Lidoderm patch, pred pulse, toradol.  Note pt went to [facility] ED 10/19: Dx of costochondritis confirmed.  No new Rx offered, except sports bra to support the breasts.  Note CT Angio chest neg 11/07/17. The pt was still in pain at the ED visit 10/19  11/14/2017 Here today for f/u . Since the last visit the patient continues to have significant chest pain in the left parasternal area.  She is unable to lay flat with this.  The pain awakens her from sleep.  She has been using the Lidoderm patch, Toradol, and prednisone as prescribed.  Prednisone is caused insomnia.  She did return to the emergency room over this past weekend and was again reassured that her diagnosis is the costochondritis.    There are no new symptoms noted. Note she did have a CT scan of the chest previously which was negative for pulmonary embolism.       Chest Pain   This is a new problem. The current episode started 1 to 4 weeks ago. The onset quality is gradual. The problem occurs constantly (ibuprofen helps but pain comes back). The problem has been unchanged. The pain is present in the lateral region (Left lateral parasternal). The pain is at a severity of 5/10. The pain is moderate. The quality of the pain is described as sharp. The pain does not radiate. Pertinent negatives include no back pain, cough, diaphoresis, dizziness, exertional chest pressure, hemoptysis, irregular heartbeat, leg pain, lower extremity edema, malaise/fatigue, nausea, near-syncope, orthopnea, palpitations, PND, shortness of breath or sputum production. Associated symptoms comments: Cannot lie flat due to pain.  NSAIDS help but has to wake up in middle of night  Cooking a lot not able to stir, pain worse . The pain is aggravated by nothing. She  has tried NSAIDs for the symptoms. The treatment provided mild relief. Risk factors include lack of exercise.  Pertinent negatives for past medical history include no COPD, no DVT, no hypertension, no PE, no PVD, no strokes and no thyroid problem.  The patient also has a history of allergic rhinitis and did request refills on nasal steroid and antihistamine Past Medical History:  Diagnosis Date  . Allergy      Family History  Problem Relation Age of Onset  . Cancer Mother        utetrine cancer   . Cancer Brother      Social History   Socioeconomic History  . Marital status: Married    Spouse name: Not on file  . Number of children: Not on file  . Years of education: Not on file  . Highest education level: Not on file  Occupational History  . Not on file  Social Needs  . Financial resource strain: Not on file  . Food insecurity:    Worry: Not on file    Inability: Not on file  . Transportation needs:    Medical: Not on file    Non-medical: Not on file  Tobacco Use  . Smoking status: Never Smoker  . Smokeless tobacco: Never Used  Substance and Sexual Activity  . Alcohol use: No  . Drug use: No  . Sexual activity: Yes  Lifestyle  . Physical activity:    Days per  week: Not on file    Minutes per session: Not on file  . Stress: Not on file  Relationships  . Social connections:    Talks on phone: Not on file    Gets together: Not on file    Attends religious service: Not on file    Active member of club or organization: Not on file    Attends meetings of clubs or organizations: Not on file    Relationship status: Not on file  . Intimate partner violence:    Fear of current or ex partner: Not on file    Emotionally abused: Not on file    Physically abused: Not on file    Forced sexual activity: Not on file  Other Topics Concern  . Not on file  Social History Narrative   ** Merged History Encounter **         Allergies  Allergen Reactions  . Tessalon  [Benzonatate]     Pt reported burning, itching on vaginal area     Outpatient Medications Prior to Visit  Medication Sig Dispense Refill  . cetirizine (ZYRTEC) 10 MG tablet Take 1 tablet (10 mg total) by mouth daily. 30 tablet 11  . fluticasone (FLONASE) 50 MCG/ACT nasal spray Place 2 sprays into both nostrils daily. 16 g 6  . hydroquinone 4 % cream Apply topically 2 (two) times daily. 28.35 g 0  . lidocaine (LIDODERM) 5 % Place 1 patch onto the skin daily. Remove & Discard patch within 12 hours or as directed by MD 10 patch 0  . Multiple Vitamin (MULTIVITAMIN WITH MINERALS) TABS tablet Take 1 tablet by mouth daily.    Marland Kitchen ketorolac (TORADOL) 10 MG tablet Take 1 tablet (10 mg total) by mouth every 6 (six) hours for 5 days. 20 tablet 0  . predniSONE (DELTASONE) 10 MG tablet Take 4 for four days 3 for four days 2 for four days 1 for four days 30 tablet 0   No facility-administered medications prior to visit.       Review of Systems  Constitutional: Negative for diaphoresis and malaise/fatigue.  Respiratory: Negative for cough, hemoptysis, sputum production and shortness of breath.   Cardiovascular: Positive for chest pain. Negative for palpitations, orthopnea, PND and near-syncope.  Gastrointestinal: Negative for nausea.  Musculoskeletal: Negative for back pain.  Neurological: Negative for dizziness.       Objective:   Physical Exam Vitals:   11/14/17 1409  BP: 120/76  Pulse: 95  Resp: 18  Temp: 98.7 F (37.1 C)  TempSrc: Oral  SpO2: 98%  Weight: 180 lb (81.6 kg)  Height: 4\' 11"  (1.499 m)    Gen: Pleasant, well-nourished, in no distress,  normal affect  ENT: No lesions,  mouth clear,  oropharynx clear, no postnasal drip  Neck: No JVD, no TMG, no carotid bruits  Lungs: No use of accessory muscles, no dullness to percussion, clear without rales or rhonchi  Cardiovascular: RRR, heart sounds normal, no murmur or gallops, no peripheral edema  Abdomen: soft and NT, no HSM,   BS normal  Musculoskeletal: No deformities, no cyanosis or clubbing, there is point tenderness left greater than right parasternal areas consistent with costochondritis   Neuro: alert, non focal  Skin: Warm, no lesions or rashes  No results found.  All laboratory data and chest x-rays EKGs are reviewed from the recent emergency room visit and were unremarkable  11/07/17 CT Angio Chest : IMPRESSION: No acute pulmonary embolus.  No active pulmonary disease.  Assessment & Plan:  I personally reviewed all images and lab data in the [facility] system as well as any outside material available during this office visit and agree with the  radiology impressions.   Costochondritis, acute Ongoing left parasternal costochondritis No other apparent etiology discerned No improvement with Toradol, Lidoderm patch, and prednisone. Note previous negative CT Angio of the chest  Plan We will discontinue Toradol Will initiate meloxicam 15 mg daily We will continue prednisone per current tapering dose We will continue Lidoderm patch to change daily The only other additional intervention would be referral to either a pain specialist orthopedics for parasternal injection of anesthetic or anti-inflammatory To be complete we will check ANA, sed rate, rheumatoid factor Return in 1 week for recheck    Shelby Mccann was seen today for follow-up.  Diagnoses and all orders for this visit:  Costochondritis, acute -     Rheumatoid factor; Future -     Sedimentation rate -     ANA; Future -     ANA -     Rheumatoid factor  Other orders -     acetaminophen (TYLENOL) 500 MG tablet; Take 1 tablet (500 mg total) by mouth every 6 (six) hours for 5 days. -     meloxicam (MOBIC) 15 MG tablet; Take 1 tablet (15 mg total) by mouth daily.   I spent 15 minutes of a 25-minute visit with this patient going over the various etiologies of costochondritis and treatment strategies and the fact that she does not have any  other diagnoses.  I answered all of her questions.  Note this was done through [employer] Spanish interpreter as well

## 2017-11-14 NOTE — Patient Instructions (Addendum)
Stop Toradol Start Meloxicam one daily with food Stay on prednisone  Start acetaminophen 500mg  4 times daily for 5 days Stay on lidoderm patch daily Labs today for autoimmune evaluation Return 1 week

## 2017-11-15 LAB — SEDIMENTATION RATE: Sed Rate: 39 mm/hr — ABNORMAL HIGH (ref 0–32)

## 2017-11-23 ENCOUNTER — Ambulatory Visit: Payer: Self-pay | Attending: Critical Care Medicine | Admitting: Critical Care Medicine

## 2017-11-23 ENCOUNTER — Encounter: Payer: Self-pay | Admitting: Critical Care Medicine

## 2017-11-23 VITALS — BP 115/74 | HR 79 | Temp 98.2°F | Resp 16 | Wt 173.0 lb

## 2017-11-23 DIAGNOSIS — M940 Chondrocostal junction syndrome [Tietze]: Secondary | ICD-10-CM | POA: Insufficient documentation

## 2017-11-23 NOTE — Patient Instructions (Signed)
Stay on meloxicam until current prescription runs out You may use Tylenol as needed Return 1 month for recheck Follow-up with your primary care physician for any other primary care concerns

## 2017-11-23 NOTE — Progress Notes (Signed)
Subjective:    Patient ID: Shelby Mccann, female    DOB: [birth year], 47 y.o.   MRN: [id number]  47 y.o. [name] seen in f/u for costochondritis. Did not improved with indocin so on last OV 10/16 we Rx Lidoderm patch, pred pulse, toradol.  11/23/2017 Since the last visit on meloxicam the patient has markedly improved There is no severe pain only minimal pain with motion of the left arm  Chest Pain   This is a new problem. The current episode started 1 to 4 weeks ago. The onset quality is gradual. The problem occurs constantly (ibuprofen helps but pain comes back). The problem has been rapidly improving. The pain is present in the lateral region (Left lateral parasternal). The pain is at a severity of 0/10. The patient is experiencing no pain. Pertinent negatives include no back pain, cough, diaphoresis, dizziness, exertional chest pressure, hemoptysis, irregular heartbeat, leg pain, lower extremity edema, malaise/fatigue, nausea, near-syncope, orthopnea, palpitations, PND, shortness of breath or sputum production. Associated symptoms comments: Cannot lie flat due to pain.  NSAIDS help but has to wake up in middle of night  Cooking a lot not able to stir, pain worse . The pain is aggravated by nothing. She has tried NSAIDs for the symptoms. The treatment provided mild relief. Risk factors include lack of exercise.  Pertinent negatives for past medical history include no COPD, no DVT, no hypertension, no PE, no PVD, no strokes and no thyroid problem.   Past Medical History:  Diagnosis Date  . Allergy      Family History  Problem Relation Age of Onset  . Cancer Mother        utetrine cancer   . Cancer Brother      Social History   Socioeconomic History  . Marital status: Married    Spouse name: Not on file  . Number of children: Not on file  . Years of education: Not on file  . Highest education level: Not on file  Occupational History  . Not on file  Social Needs  .  Financial resource strain: Not on file  . Food insecurity:    Worry: Not on file    Inability: Not on file  . Transportation needs:    Medical: Not on file    Non-medical: Not on file  Tobacco Use  . Smoking status: Never Smoker  . Smokeless tobacco: Never Used  Substance and Sexual Activity  . Alcohol use: No  . Drug use: No  . Sexual activity: Yes  Lifestyle  . Physical activity:    Days per week: Not on file    Minutes per session: Not on file  . Stress: Not on file  Relationships  . Social connections:    Talks on phone: Not on file    Gets together: Not on file    Attends religious service: Not on file    Active member of club or organization: Not on file    Attends meetings of clubs or organizations: Not on file    Relationship status: Not on file  . Intimate partner violence:    Fear of current or ex partner: Not on file    Emotionally abused: Not on file    Physically abused: Not on file    Forced sexual activity: Not on file  Other Topics Concern  . Not on file  Social History Narrative   ** Merged History Encounter **         Allergies  Allergen Reactions  . Tessalon [Benzonatate]     Pt reported burning, itching on vaginal area     Outpatient Medications Prior to Visit  Medication Sig Dispense Refill  . cetirizine (ZYRTEC) 10 MG tablet Take 1 tablet (10 mg total) by mouth daily. 30 tablet 11  . fluticasone (FLONASE) 50 MCG/ACT nasal spray Place 2 sprays into both nostrils daily. 16 g 6  . hydroquinone 4 % cream Apply topically 2 (two) times daily. 28.35 g 0  . meloxicam (MOBIC) 15 MG tablet Take 1 tablet (15 mg total) by mouth daily. 30 tablet 0  . Multiple Vitamin (MULTIVITAMIN WITH MINERALS) TABS tablet Take 1 tablet by mouth daily.    Marland Kitchen lidocaine (LIDODERM) 5 % Place 1 patch onto the skin daily. Remove & Discard patch within 12 hours or as directed by MD 10 patch 0   No facility-administered medications prior to visit.       Review of Systems    Constitutional: Negative for diaphoresis and malaise/fatigue.  Respiratory: Negative for cough, hemoptysis, sputum production and shortness of breath.   Cardiovascular: Positive for chest pain. Negative for palpitations, orthopnea, PND and near-syncope.  Gastrointestinal: Negative for nausea.  Musculoskeletal: Negative for back pain.  Neurological: Negative for dizziness.       Objective:   Physical Exam Vitals:   11/23/17 1110  BP: 115/74  Pulse: 79  Resp: 16  Temp: 98.2 F (36.8 C)  TempSrc: Oral  SpO2: 98%  Weight: 173 lb (78.5 kg)    Gen: Pleasant, well-nourished, in no distress,  normal affect  ENT: No lesions,  mouth clear,  oropharynx clear, no postnasal drip  Neck: No JVD, no TMG, no carotid bruits  Lungs: No use of accessory muscles, no dullness to percussion, clear without rales or rhonchi The tenderness in the left parasternal area is now resolved on exam  Cardiovascular: RRR, heart sounds normal, no murmur or gallops, no peripheral edema  Abdomen: soft and NT, no HSM,  BS normal  Musculoskeletal: No deformities, no cyanosis or clubbing,  No tenderness in anterior chest wall  Neuro: alert, non focal  Skin: Warm, no lesions or rashes  No results found.  All laboratory data and chest x-rays EKGs are reviewed from the recent emergency room visit and were unremarkable  11/07/17 CT Angio Chest : IMPRESSION: No acute pulmonary embolus.  No active pulmonary disease.       Assessment & Plan:  I personally reviewed all images and lab data in the [facility] system as well as any outside material available during this office visit and agree with the  radiology impressions.   Costochondritis, acute Acute costochondritis now improved Note sed rate was normal Note ANA and rheumatoid factor were not processed  Plan Can complete current course of meloxicam 15 mg daily until current prescription is complete Use Tylenol as needed No further Lidoderm patch or  prednisone needed Return in 1 month for recheck No need to reprocess ANA or rheumatoid factor   [name] was seen today for follow-up.  Diagnoses and all orders for this visit:  Costochondritis, acute

## 2017-11-23 NOTE — Assessment & Plan Note (Signed)
Acute costochondritis now improved Note sed rate was normal Note ANA and rheumatoid factor were not processed  Plan Can complete current course of meloxicam 15 mg daily until current prescription is complete Use Tylenol as needed No further Lidoderm patch or prednisone needed Return in 1 month for recheck No need to reprocess ANA or rheumatoid factor

## 2018-01-06 ENCOUNTER — Encounter: Payer: Self-pay | Admitting: Critical Care Medicine

## 2018-01-06 ENCOUNTER — Ambulatory Visit: Payer: Self-pay | Attending: Critical Care Medicine | Admitting: Critical Care Medicine

## 2018-01-06 VITALS — BP 101/70 | HR 88 | Temp 98.2°F | Resp 18 | Ht 59.0 in | Wt 179.0 lb

## 2018-01-06 DIAGNOSIS — Z888 Allergy status to other drugs, medicaments and biological substances status: Secondary | ICD-10-CM | POA: Insufficient documentation

## 2018-01-06 DIAGNOSIS — M940 Chondrocostal junction syndrome [Tietze]: Secondary | ICD-10-CM

## 2018-01-06 DIAGNOSIS — L811 Chloasma: Secondary | ICD-10-CM

## 2018-01-06 MED ORDER — MELOXICAM 15 MG PO TABS: 15.0000 mg | ORAL_TABLET | Freq: Every day | ORAL | 6 refills | Status: DC

## 2018-01-06 MED ORDER — HYDROQUINONE 4 % EX CREA: TOPICAL_CREAM | Freq: Two times a day (BID) | CUTANEOUS | 0 refills | Status: DC

## 2018-01-06 MED FILL — MELOXICAM 15 MG TABLET: 15 | 30 days supply | Qty: 30 | Fill #0

## 2018-01-06 NOTE — Progress Notes (Signed)
Subjective:    Patient ID: Shelby Mccann, female    DOB: Aug 25, 1970, 47 y.o.   MRN: [id number]  47 y.o. [name] seen in f/u for costochondritis.   01/06/2018 Last seen 11/23/17.  Since that visit:  Pain in chest area: still not having pain and not able to do normal activity. Now out of meloxicam , it did not completely help. Not able to lay in bed completely it will hurt.  Out of medicine for one month.    Chest Pain   This is a new problem. The current episode started 1 to 4 weeks ago. The onset quality is gradual. The problem occurs constantly (ibuprofen helps but pain comes back). The problem has been rapidly improving. The pain is present in the lateral region (Left lateral parasternal). The pain is at a severity of 0/10. The patient is experiencing no pain. Pertinent negatives include no back pain, cough, diaphoresis, dizziness, exertional chest pressure, hemoptysis, irregular heartbeat, leg pain, lower extremity edema, malaise/fatigue, nausea, near-syncope, orthopnea, palpitations, PND, shortness of breath or sputum production. Associated symptoms comments: Cannot lie flat due to pain.  NSAIDS help but has to wake up in middle of night  Cooking a lot not able to stir, pain worse . The pain is aggravated by nothing. She has tried NSAIDs for the symptoms. The treatment provided mild relief. Risk factors include lack of exercise.  Pertinent negatives for past medical history include no COPD, no DVT, no hypertension, no PE, no PVD, no strokes and no thyroid problem.   Past Medical History:  Diagnosis Date  . Allergy      Family History  Problem Relation Age of Onset  . Cancer Mother        utetrine cancer   . Cancer Brother      Social History   Socioeconomic History  . Marital status: Married    Spouse name: Not on file  . Number of children: Not on file  . Years of education: Not on file  . Highest education level: Not on file  Occupational History  . Not on  file  Social Needs  . Financial resource strain: Not on file  . Food insecurity:    Worry: Not on file    Inability: Not on file  . Transportation needs:    Medical: Not on file    Non-medical: Not on file  Tobacco Use  . Smoking status: Never Smoker  . Smokeless tobacco: Never Used  Substance and Sexual Activity  . Alcohol use: No  . Drug use: No  . Sexual activity: Yes  Lifestyle  . Physical activity:    Days per week: Not on file    Minutes per session: Not on file  . Stress: Not on file  Relationships  . Social connections:    Talks on phone: Not on file    Gets together: Not on file    Attends religious service: Not on file    Active member of club or organization: Not on file    Attends meetings of clubs or organizations: Not on file    Relationship status: Not on file  . Intimate partner violence:    Fear of current or ex partner: Not on file    Emotionally abused: Not on file    Physically abused: Not on file    Forced sexual activity: Not on file  Other Topics Concern  . Not on file  Social History Narrative   ** Merged History  Encounter **         Allergies  Allergen Reactions  . Tessalon [Benzonatate]     Pt reported burning, itching on vaginal area     Outpatient Medications Prior to Visit  Medication Sig Dispense Refill  . hydroquinone 4 % cream Apply topically 2 (two) times daily. 28.35 g 0  . cetirizine (ZYRTEC) 10 MG tablet Take 1 tablet (10 mg total) by mouth daily. (Patient not taking: Reported on 01/06/2018) 30 tablet 11  . fluticasone (FLONASE) 50 MCG/ACT nasal spray Place 2 sprays into both nostrils daily. (Patient not taking: Reported on 01/06/2018) 16 g 6  . Multiple Vitamin (MULTIVITAMIN WITH MINERALS) TABS tablet Take 1 tablet by mouth daily.    . meloxicam (MOBIC) 15 MG tablet Take 1 tablet (15 mg total) by mouth daily. (Patient not taking: Reported on 01/06/2018) 30 tablet 0   No facility-administered medications prior to visit.        Review of Systems  Constitutional: Negative for diaphoresis and malaise/fatigue.  Respiratory: Negative for cough, hemoptysis, sputum production and shortness of breath.   Cardiovascular: Positive for chest pain. Negative for palpitations, orthopnea, PND and near-syncope.  Gastrointestinal: Negative for nausea.  Musculoskeletal: Negative for back pain.  Neurological: Negative for dizziness.       Objective:   Physical Exam  Vitals:   01/06/18 1054  BP: 101/70  Pulse: 88  Resp: 18  Temp: 98.2 F (36.8 C)  TempSrc: Oral  SpO2: 99%  Weight: 179 lb (81.2 kg)  Height: 4\' 11"  (1.499 m)    Gen: Pleasant, well-nourished, in no distress,  normal affect  ENT: No lesions,  mouth clear,  oropharynx clear, no postnasal drip  Neck: No JVD, no TMG, no carotid bruits  Lungs: No use of accessory muscles, no dullness to percussion, clear without rales or rhonchi The tenderness in the left parasternal area has recurred Note a careful breast exam shows no evidence of breast tissue tenderness on the left  Cardiovascular: RRR, heart sounds normal, no murmur or gallops, no peripheral edema  Abdomen: soft and NT, no HSM,  BS normal  Musculoskeletal: No deformities, no cyanosis or clubbing,  No tenderness in anterior chest wall  Neuro: alert, non focal  Skin: Warm, no lesions or rashes  No results found.  All laboratory data and chest x-rays EKGs are reviewed from the recent emergency room visit and were unremarkable  11/07/17 CT Angio Chest : IMPRESSION: No acute pulmonary embolus.  No active pulmonary disease.       Assessment & Plan:  I personally reviewed all images and [facility] as well as any outside material available during this office visit and agree with the  radiology impressions.   Chronic Costochondritis Chronic costochondritis The patient was improved while on nonsteroidal since tapering off the patient's symptom complex has  worsened  I reviewed the breast exam today and it was not apparent that this is emanating from breast tissue but rather the chest wall and its associated musculoskeletal area  Plan Resume meloxicam 15 mg daily Refer to physical therapy for evaluation and treatment   Milania was seen today for follow-up.  Diagnoses and all orders for this visit:  Costochondritis, acute -     Ambulatory referral to Physical Therapy  Melasma -     hydroquinone 4 % cream; Apply topically 2 (two) times daily.  Costochondritis  Other orders -     meloxicam (MOBIC) 15 MG tablet; Take 1 tablet (  15 mg total) by mouth daily.

## 2018-01-06 NOTE — Patient Instructions (Signed)
Resume meloxicam and take 1 daily  A physical therapy consult will be obtained  Return in 6 weeks

## 2018-01-06 NOTE — Assessment & Plan Note (Signed)
Chronic costochondritis The patient was improved while on nonsteroidal since tapering off the patient's symptom complex has worsened  I reviewed the breast exam today and it was not apparent that this is emanating from breast tissue but rather the chest wall and its associated musculoskeletal area  Plan Resume meloxicam 15 mg daily Refer to physical therapy for evaluation and treatment

## 2018-02-12 NOTE — Progress Notes (Signed)
Subjective:    Patient ID: Shelby Mccann, female    DOB: [birth year], 48 y.o.   MRN: [id number]  48 y.o. [name] seen in f/u for costochondritis.   01/06/2018 Last seen 11/23/17.  Since that visit:  Pain in chest area: still not having pain and not able to do normal activity. Now out of meloxicam , it did not completely help. Not able to lay in bed completely it will hurt.  Out of medicine for one month.  02/13/2018 Last seen 12/13 for Costochrondritis f/u.   Pain in chest area was doing better then returned:  Over the past week.  Burning in the middle of the chest. occ has pain on the other side as well.   Now with side effects with meloxicam    Pain is not as bad as before.  If lowers head and then raise the head will cause the pain.  Pain is ppt:  If move a lot or speak a lot is worse.     Chest Pain   This is a chronic problem. The current episode started more than 1 month ago. The onset quality is gradual. The problem occurs constantly (ibuprofen helps but pain comes back). The problem has been rapidly worsening. The pain is present in the lateral region and substernal region (Left lateral parasternal). The pain is at a severity of 0/10. The pain is moderate. Pertinent negatives include no back pain, cough, diaphoresis, dizziness, exertional chest pressure, hemoptysis, irregular heartbeat, leg pain, lower extremity edema, malaise/fatigue, nausea, near-syncope, orthopnea, palpitations, PND, shortness of breath or sputum production. Associated symptoms comments: Cannot lie flat due to pain.  NSAIDS help but has to wake up in middle of night  Cooking a lot not able to stir, pain worse . The pain is aggravated by nothing. She has tried NSAIDs for the symptoms. The treatment provided mild relief. Risk factors include lack of exercise.  Pertinent negatives for past medical history include no COPD, no DVT, no hypertension, no PE, no PVD, no strokes and no thyroid problem.   Past  Medical History:  Diagnosis Date  . Allergy      Family History  Problem Relation Age of Onset  . Cancer Mother        utetrine cancer   . Cancer Brother      Social History   Socioeconomic History  . Marital status: Married    Spouse name: Not on file  . Number of children: Not on file  . Years of education: Not on file  . Highest education level: Not on file  Occupational History  . Not on file  Social Needs  . Financial resource strain: Not on file  . Food insecurity:    Worry: Not on file    Inability: Not on file  . Transportation needs:    Medical: Not on file    Non-medical: Not on file  Tobacco Use  . Smoking status: Never Smoker  . Smokeless tobacco: Never Used  Substance and Sexual Activity  . Alcohol use: No  . Drug use: No  . Sexual activity: Yes  Lifestyle  . Physical activity:    Days per week: Not on file    Minutes per session: Not on file  . Stress: Not on file  Relationships  . Social connections:    Talks on phone: Not on file    Gets together: Not on file    Attends religious service: Not on file  Active member of club or organization: Not on file    Attends meetings of clubs or organizations: Not on file    Relationship status: Not on file  . Intimate partner violence:    Fear of current or ex partner: Not on file    Emotionally abused: Not on file    Physically abused: Not on file    Forced sexual activity: Not on file  Other Topics Concern  . Not on file  Social History Narrative   ** Merged History Encounter **         Allergies  Allergen Reactions  . Tessalon [Benzonatate]     Pt reported burning, itching on vaginal area     Outpatient Medications Prior to Visit  Medication Sig Dispense Refill  . cetirizine (ZYRTEC) 10 MG tablet Take 1 tablet (10 mg total) by mouth daily. 30 tablet 11  . fluticasone (FLONASE) 50 MCG/ACT nasal spray Place 2 sprays into both nostrils daily. 16 g 6  . meloxicam (MOBIC) 15 MG tablet Take 1  tablet (15 mg total) by mouth daily. 30 tablet 6  . Multiple Vitamin (MULTIVITAMIN WITH MINERALS) TABS tablet Take 1 tablet by mouth daily.    . hydroquinone 4 % cream Apply topically 2 (two) times daily. (Patient not taking: Reported on 02/13/2018) 28.35 g 0   No facility-administered medications prior to visit.       Review of Systems  Constitutional: Negative for diaphoresis and malaise/fatigue.  Respiratory: Negative for cough, hemoptysis, sputum production and shortness of breath.   Cardiovascular: Positive for chest pain. Negative for palpitations, orthopnea, PND and near-syncope.  Gastrointestinal: Negative for nausea.  Musculoskeletal: Negative for back pain.  Neurological: Negative for dizziness.       Objective:   Physical Exam  Vitals:   02/13/18 0841  BP: 112/63  Pulse: 89  Resp: 12  Temp: 97.9 F (36.6 C)  TempSrc: Oral  SpO2: 98%  Weight: 183 lb (83 kg)    Gen: Pleasant, well-nourished, in no distress,  normal affect  ENT: No lesions,  mouth clear,  oropharynx clear, no postnasal drip  Neck: No JVD, no TMG, no carotid bruits  Lungs: No use of accessory muscles, no dullness to percussion, clear without rales or rhonchi  The tenderness in the left parasternal area has recurred and persists  Cardiovascular: RRR, heart sounds normal, no murmur or gallops, no peripheral edema  Abdomen: soft and NT, no HSM,  BS normal  Musculoskeletal: No deformities, no cyanosis or clubbing,  No tenderness in anterior chest wall  Neuro: alert, non focal  Skin: Warm, no lesions or rashes  No results found.  All laboratory data and chest x-rays EKGs are reviewed from the recent emergency room visit and were unremarkable  11/07/17 CT Angio Chest : IMPRESSION: No acute pulmonary embolus.  No active pulmonary disease.       Assessment & Plan:  I personally reviewed [facility] as well as any outside material available during this  office visit and agree with the  radiology impressions.   Chronic Costochondritis Ongoing recurrent costochondritis that did respond to nonsteroidal anti-inflammatories orally but patient has multiple concerns about taking this medication long-term.  No other underlying cause in the pulmonary or cardiovascular system for chest pain. Pulmonary embolism, coronary ischemia, and right vessel abnormalities of all been excluded.  Plan Referral to pain management to consider other alternatives to treat costochondritis including potentially injecting the intercostal muscles with anti-inflammatory  Physical therapy referral as well to consider exercises that may improve muscle spasticity of the chest wall  We will discontinue meloxicam and switch to diclofenac cream to apply to the affected area on the left chest wall 3 times daily    Age spots The patient desires a refill on the steroid cream that was previously prescribed for her facial aging spots  1% hydrocortisone cream was sent to the pharmacy   Shelby Mccann was seen today for follow-up.  Diagnoses and all orders for this visit:  Chronic Costochondritis -     Ambulatory referral to Physical Therapy -     Ambulatory referral to Pain Clinic  Age spots  Other orders -     hydrocortisone cream 0.5 %; Apply 1 application topically 2 (two) times daily. -     Diclofenac Sodium 1 % CREA; Apply to affected area three time daily for chest wall pain

## 2018-02-13 ENCOUNTER — Encounter: Payer: Self-pay | Admitting: Critical Care Medicine

## 2018-02-13 ENCOUNTER — Other Ambulatory Visit: Payer: Self-pay

## 2018-02-13 ENCOUNTER — Ambulatory Visit: Payer: Self-pay | Attending: Critical Care Medicine | Admitting: Critical Care Medicine

## 2018-02-13 ENCOUNTER — Other Ambulatory Visit: Payer: Self-pay | Admitting: Pharmacist

## 2018-02-13 VITALS — BP 112/63 | HR 89 | Temp 97.9°F | Resp 12 | Wt 183.0 lb

## 2018-02-13 DIAGNOSIS — M940 Chondrocostal junction syndrome [Tietze]: Secondary | ICD-10-CM

## 2018-02-13 DIAGNOSIS — L814 Other melanin hyperpigmentation: Secondary | ICD-10-CM

## 2018-02-13 MED ORDER — HYDROCORTISONE 0.5 % EX CREA: 1.0000 "application " | TOPICAL_CREAM | Freq: Two times a day (BID) | CUTANEOUS | 0 refills | Status: DC

## 2018-02-13 MED ORDER — DICLOFENAC SODIUM 1 % TD GEL: 2.0000 g | Freq: Three times a day (TID) | TRANSDERMAL | 0 refills | Status: DC

## 2018-02-13 MED ORDER — DICLOFENAC SODIUM 1 % TD CREA: TOPICAL_CREAM | TRANSDERMAL | 0 refills | Status: DC

## 2018-02-13 MED FILL — DICLOFENAC SODIUM 1% GEL: 1 | 16 days supply | Qty: 100 | Fill #0

## 2018-02-13 NOTE — Assessment & Plan Note (Signed)
The patient desires a refill on the steroid cream that was previously prescribed for her facial aging spots  1% hydrocortisone cream was sent to the pharmacy

## 2018-02-13 NOTE — Assessment & Plan Note (Signed)
Ongoing recurrent costochondritis that did respond to nonsteroidal anti-inflammatories orally but patient has multiple concerns about taking this medication long-term.  No other underlying cause in the pulmonary or cardiovascular system for chest pain. Pulmonary embolism, coronary ischemia, and right vessel abnormalities of all been excluded.  Plan Referral to pain management to consider other alternatives to treat costochondritis including potentially injecting the intercostal muscles with anti-inflammatory  Physical therapy referral as well to consider exercises that may improve muscle spasticity of the chest wall  We will discontinue meloxicam and switch to diclofenac cream to apply to the affected area on the left chest wall 3 times daily

## 2018-02-13 NOTE — Patient Instructions (Signed)
A referral to pain management specialist and physical therapy will be made  Discontinue meloxicam  Begin diclofenac cream applied to affected areas on chest wall 3 times daily  Hydrocortisone cream was sent to the pharmacy as well for your facial inflammation  Please establish follow-up primary care visit

## 2018-02-22 ENCOUNTER — Ambulatory Visit: Payer: Self-pay | Admitting: Physical Therapy

## 2018-05-01 ENCOUNTER — Ambulatory Visit: Payer: Self-pay | Attending: Primary Care | Admitting: Primary Care

## 2018-05-01 ENCOUNTER — Other Ambulatory Visit: Payer: Self-pay

## 2018-05-01 ENCOUNTER — Encounter: Payer: Self-pay | Admitting: Primary Care

## 2018-05-01 DIAGNOSIS — R42 Dizziness and giddiness: Secondary | ICD-10-CM

## 2018-05-01 DIAGNOSIS — M940 Chondrocostal junction syndrome [Tietze]: Secondary | ICD-10-CM

## 2018-05-01 MED ORDER — MECLIZINE HCL 12.5 MG PO TABS: 25.0000 mg | ORAL_TABLET | Freq: Three times a day (TID) | ORAL | Status: DC | PRN

## 2018-05-01 NOTE — Progress Notes (Signed)
Dizziness x 3 weeks  When she gets out of bed   Intermittent Nausea "I lay down very slowly so that I do not vomit because I feel like it"  Has problems in her chest from previous

## 2018-05-01 NOTE — Progress Notes (Signed)
Acute Office Visit  Subjective:    Patient ID: Shelby Mccann, female    DOB: 1971-01-06, 48 y.o.   MRN: [id number]  No chief complaint on file.   HPI Patient is in today for c/o dizziness for 3 weeks. Also stated she was having chest pain her chiropractor told her to use ice pack  for comfort.  Past Medical History:  Diagnosis Date  . Allergy     Past Surgical History:  Procedure Laterality Date  . ABDOMINAL HYSTERECTOMY  2013    uterus fell   . CESAREAN SECTION      Family History  Problem Relation Age of Onset  . Cancer Mother        utetrine cancer   . Cancer Brother     Social History   Socioeconomic History  . Marital status: Married    Spouse name: Not on file  . Number of children: Not on file  . Years of education: Not on file  . Highest education level: Not on file  Occupational History  . Not on file  Social Needs  . Financial resource strain: Not on file  . Food insecurity:    Worry: Not on file    Inability: Not on file  . Transportation needs:    Medical: Not on file    Non-medical: Not on file  Tobacco Use  . Smoking status: Never Smoker  . Smokeless tobacco: Never Used  Substance and Sexual Activity  . Alcohol use: No  . Drug use: No  . Sexual activity: Yes  Lifestyle  . Physical activity:    Days per week: Not on file    Minutes per session: Not on file  . Stress: Not on file  Relationships  . Social connections:    Talks on phone: Not on file    Gets together: Not on file    Attends religious service: Not on file    Active member of club or organization: Not on file    Attends meetings of clubs or organizations: Not on file    Relationship status: Not on file  . Intimate partner violence:    Fear of current or ex partner: Not on file    Emotionally abused: Not on file    Physically abused: Not on file    Forced sexual activity: Not on file  Other Topics Concern  . Not on file  Social History Narrative   ** Merged  History Encounter **        Outpatient Medications Prior to Visit  Medication Sig Dispense Refill  . cetirizine (ZYRTEC) 10 MG tablet Take 1 tablet (10 mg total) by mouth daily. (Patient not taking: Reported on 05/01/2018) 30 tablet 11  . diclofenac sodium (VOLTAREN) 1 % GEL Apply 2 g topically 3 (three) times daily. (Patient not taking: Reported on 05/01/2018) 100 g 0  . fluticasone (FLONASE) 50 MCG/ACT nasal spray Place 2 sprays into both nostrils daily. (Patient not taking: Reported on 05/01/2018) 16 g 6  . hydrocortisone cream 0.5 % Apply 1 application topically 2 (two) times daily. (Patient not taking: Reported on 05/01/2018) 30 g 0  . Multiple Vitamin (MULTIVITAMIN WITH MINERALS) TABS tablet Take 1 tablet by mouth daily.     No facility-administered medications prior to visit.     Allergies  Allergen Reactions  . Tessalon [Benzonatate]     Pt reported burning, itching on vaginal area    Review of Systems  Constitutional: Negative.   HENT:  Negative.   Eyes: Negative.   Respiratory: Negative.   Cardiovascular: Positive for chest pain.  Gastrointestinal: Negative.   Genitourinary: Negative.   Musculoskeletal: Negative.   Skin: Negative.   Neurological: Negative.   Endo/Heme/Allergies: Negative.   Psychiatric/Behavioral: Negative.        Objective:    Physical Exam  LMP 01/26/2011  Wt Readings from Last 3 Encounters:  02/13/18 183 lb (83 kg)  01/06/18 179 lb (81.2 kg)  11/23/17 173 lb (78.5 kg)    Health Maintenance Due  Topic Date Due  . HIV Screening  03/11/1984    There are no preventive care reminders to display for this patient.   Lab Results  Component Value Date   TSH 1.114 05/07/2014   Lab Results  Component Value Date   WBC 9.8 11/06/2017   HGB 14.3 11/06/2017   HCT 42.0 11/06/2017   MCV 83.9 11/06/2017   PLT 375 11/06/2017   Lab Results  Component Value Date   NA 139 11/06/2017   K 4.1 11/06/2017   CO2 25 11/06/2017   GLUCOSE 100 (H)  11/06/2017   BUN 24 (H) 11/06/2017   CREATININE 0.80 11/06/2017   BILITOT <0.2 10/18/2016   ALKPHOS 106 10/18/2016   AST 18 10/18/2016   ALT 19 10/18/2016   PROT 7.5 10/18/2016   ALBUMIN 4.6 10/18/2016   CALCIUM 9.5 11/06/2017   ANIONGAP 9 11/06/2017   Lab Results  Component Value Date   CHOL 179 01/07/2014   Lab Results  Component Value Date   HDL 50 01/07/2014   Lab Results  Component Value Date   LDLCALC 102 (H) 01/07/2014   Lab Results  Component Value Date   TRIG 134 01/07/2014   Lab Results  Component Value Date   CHOLHDL 3.6 01/07/2014   Lab Results  Component Value Date   HGBA1C 5.8 10/18/2016       Assessment & Plan:   Problem List Items Addressed This Visit    Chronic Costochondritis   OTC ibuprofen cont with ice pack if provides relief     Vertigo    -  Primary   Relevant Medications   meclizine (ANTIVERT) tablet 25 mg    Diagnoses and all orders for this visit:    Meds ordered this encounter  Medications  . meclizine (ANTIVERT) tablet 25 mg     Grayce Sessions, NP

## 2018-05-02 ENCOUNTER — Telehealth: Payer: Self-pay | Admitting: Physician Assistant

## 2018-05-02 NOTE — Telephone Encounter (Signed)
1) Medication(s) Requested (by name): meclizine (ANTIVERT) tablet 25 mg     2) Pharmacy of Choice: [facility] [address] 3) Special Requests:   Approved medications will be sent to the pharmacy, we will reach out if there is an issue.  Requests made after 3pm may not be addressed until the following business day!  If a patient is unsure of the name of the medication(s) please note and ask patient to call back when they are able to provide all info, do not send to responsible party until all information is available!

## 2018-05-03 NOTE — Telephone Encounter (Signed)
Please advise if patient can have Rx for antivert.  It says in "meds" it was clinically administered.

## 2018-10-17 ENCOUNTER — Ambulatory Visit (INDEPENDENT_AMBULATORY_CARE_PROVIDER_SITE_OTHER): Payer: Self-pay | Admitting: Primary Care

## 2019-05-14 ENCOUNTER — Encounter (HOSPITAL_COMMUNITY): Payer: Self-pay | Admitting: Family Medicine

## 2019-05-14 ENCOUNTER — Ambulatory Visit (HOSPITAL_COMMUNITY)
Admission: EM | Admit: 2019-05-14 | Discharge: 2019-05-14 | Disposition: A | Discharge status: Home / Self Care | Payer: Self-pay | Attending: Family Medicine | Admitting: Family Medicine

## 2019-05-14 ENCOUNTER — Other Ambulatory Visit: Payer: Self-pay

## 2019-05-14 DIAGNOSIS — R42 Dizziness and giddiness: Secondary | ICD-10-CM

## 2019-05-14 MED ORDER — PREDNISONE 10 MG PO TABS: 10.0000 mg | ORAL_TABLET | Freq: Every day | ORAL | 0 refills | Status: DC

## 2019-05-14 MED FILL — predniSONE 10 MG TABS: 10 | 5 days supply | Qty: 5 | Fill #0

## 2019-05-14 NOTE — ED Provider Notes (Signed)
[facility]    CSN: [id number] Arrival date & time: 05/14/19  1003      History   Chief Complaint Chief Complaint  Patient presents with  . Dizziness    HPI Shelby Mccann is a 49 y.o. female.   Initial [facility] visit for this 49 yo woman complaining of dizziness for the past 2 days.  She was prescribed meclizine but it makes her extremely drowsy.  The vertigo has been associated with vomiting when she moves her head in certain directions.  No other neurological or audiological sx.  No fever or stiff neck.     Past Medical History:  Diagnosis Date  . Allergy     Patient Active Problem List   Diagnosis Date Noted  . Chronic Costochondritis 11/02/2017  . Age spots 04/04/2015  . Other and unspecified hyperlipidemia 02/28/2013  . Environmental allergies 01/17/2013    Past Surgical History:  Procedure Laterality Date  . ABDOMINAL HYSTERECTOMY  2013    uterus fell   . CESAREAN SECTION      OB History    Gravida  0   Para  0   Term  0   Preterm  0   AB  0   Living        SAB  0   TAB  0   Ectopic  0   Multiple      Live Births               Home Medications    Prior to Admission medications   Medication Sig Start Date End Date Taking? Authorizing Provider  Multiple Vitamin (MULTIVITAMIN WITH MINERALS) TABS tablet Take 1 tablet by mouth daily.    [provider]  predniSONE (DELTASONE) 10 MG tablet Take 1 tablet (10 mg total) by mouth daily with breakfast. 05/14/19   Elvina Sidle, MD  cetirizine (ZYRTEC) 10 MG tablet Take 1 tablet (10 mg total) by mouth daily. Patient not taking: Reported on 05/01/2018 11/02/17 05/14/19  Storm Frisk, MD  fluticasone [facility]) 50 MCG/ACT nasal spray Place 2 sprays into both nostrils daily. Patient not taking: Reported on 05/01/2018 11/02/17 05/14/19  Storm Frisk, MD    Family History Family History  Problem Relation Age of Onset  . Cancer Mother        utetrine cancer   .  Cancer Brother     Social History Social History   Tobacco Use  . Smoking status: Never Smoker  . Smokeless tobacco: Never Used  Substance Use Topics  . Alcohol use: No  . Drug use: No     Allergies   Tessalon [benzonatate]   Review of Systems Review of Systems  Constitutional: Negative.   HENT: Negative for ear pain and hearing loss.   Gastrointestinal: Positive for vomiting.  Neurological: Positive for dizziness.  All other systems reviewed and are negative.    Physical Exam Triage Vital Signs ED Triage Vitals [05/14/19 1050]  Enc Vitals Group     BP (!) 124/101     Pulse Rate 75     Resp 16     Temp 98.5 F (36.9 C)     Temp src      SpO2 100 %     Weight      Height      Head Circumference      Peak Flow      Pain Score      Pain Loc  Pain Edu?      Excl. in [place]?    No data found.  Updated Vital Signs BP (!) 124/101   Pulse 75   Temp 98.5 F (36.9 C)   Resp 16   LMP 01/26/2011   SpO2 100%    Physical Exam Vitals and nursing note reviewed.  Constitutional:      Appearance: Normal appearance. She is obese.  HENT:     Head: Normocephalic.     Right Ear: Tympanic membrane, ear canal and external ear normal.     Left Ear: Tympanic membrane, ear canal and external ear normal.  Eyes:     Conjunctiva/sclera: Conjunctivae normal.  Pulmonary:     Effort: Pulmonary effort is normal.  Musculoskeletal:        General: Normal range of motion.     Cervical back: Normal range of motion and neck supple.  Skin:    General: Skin is warm and dry.  Neurological:     General: No focal deficit present.     Mental Status: She is alert.  Psychiatric:        Mood and Affect: Mood normal.      UC Treatments / Results  Labs (all labs ordered are listed, but only abnormal results are displayed) Labs from 2019 showed elevated sed rate, otherwise neg. \ EKG   Radiology No results found.  Procedures Procedures (including critical care  time)  Medications Ordered in UC Medications - No data to display  Initial Impression / Assessment and Plan / UC Course  I have reviewed the triage vital signs and the nursing notes.  Pertinent labs & imaging results that were available during my care of the patient were reviewed by me and considered in my medical decision making (see chart for details).    Final Clinical Impressions(s) / UC Diagnoses   Final diagnoses:  Vertigo   Discharge Instructions   None    ED Prescriptions    Medication Sig Dispense Auth. Provider   predniSONE (DELTASONE) 10 MG tablet Take 1 tablet (10 mg total) by mouth daily with breakfast. 5 tablet Robyn Haber, MD     I have reviewed the PDMP during this encounter.   Robyn Haber, MD 05/14/19 [phone]

## 2019-05-14 NOTE — ED Triage Notes (Signed)
Pt c/o dizziness and nausea/vomiting since yesterday. Pt taking meclizine at home with improvement.

## 2019-05-15 ENCOUNTER — Emergency Department (HOSPITAL_COMMUNITY): Payer: Self-pay

## 2019-05-15 ENCOUNTER — Encounter (HOSPITAL_COMMUNITY): Payer: Self-pay | Admitting: Emergency Medicine

## 2019-05-15 ENCOUNTER — Other Ambulatory Visit: Payer: Self-pay

## 2019-05-15 ENCOUNTER — Emergency Department (HOSPITAL_COMMUNITY)
Admission: EM | Admit: 2019-05-15 | Discharge: 2019-05-15 | Disposition: A | Discharge status: Home / Self Care | Payer: Self-pay | Attending: Emergency Medicine | Admitting: Emergency Medicine

## 2019-05-15 DIAGNOSIS — R0789 Other chest pain: Secondary | ICD-10-CM | POA: Insufficient documentation

## 2019-05-15 DIAGNOSIS — Y92410 Unspecified street and highway as the place of occurrence of the external cause: Secondary | ICD-10-CM | POA: Insufficient documentation

## 2019-05-15 DIAGNOSIS — T148XXA Other injury of unspecified body region, initial encounter: Secondary | ICD-10-CM

## 2019-05-15 DIAGNOSIS — Y93I9 Activity, other involving external motion: Secondary | ICD-10-CM | POA: Insufficient documentation

## 2019-05-15 DIAGNOSIS — Y999 Unspecified external cause status: Secondary | ICD-10-CM | POA: Insufficient documentation

## 2019-05-15 NOTE — ED Notes (Signed)
Patient Alert and oriented to baseline. Stable and ambulatory to baseline. Patient verbalized understanding of the discharge instructions.  Patient belongings were taken by the patient.   
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
## 2020-06-06 ENCOUNTER — Ambulatory Visit (INDEPENDENT_AMBULATORY_CARE_PROVIDER_SITE_OTHER): Payer: Self-pay | Admitting: Primary Care

## 2020-06-06 ENCOUNTER — Encounter (INDEPENDENT_AMBULATORY_CARE_PROVIDER_SITE_OTHER): Payer: Self-pay | Admitting: Primary Care

## 2020-06-06 ENCOUNTER — Other Ambulatory Visit: Payer: Self-pay

## 2020-06-06 VITALS — BP 105/62 | HR 76 | Temp 97.5°F | Ht 59.0 in | Wt 167.4 lb

## 2020-06-06 DIAGNOSIS — Z9109 Other allergy status, other than to drugs and biological substances: Secondary | ICD-10-CM

## 2020-06-06 DIAGNOSIS — M79642 Pain in left hand: Secondary | ICD-10-CM

## 2020-06-06 DIAGNOSIS — M79641 Pain in right hand: Secondary | ICD-10-CM

## 2020-06-06 DIAGNOSIS — Z131 Encounter for screening for diabetes mellitus: Secondary | ICD-10-CM

## 2020-06-06 DIAGNOSIS — Z6833 Body mass index (BMI) 33.0-33.9, adult: Secondary | ICD-10-CM

## 2020-06-06 DIAGNOSIS — E6609 Other obesity due to excess calories: Secondary | ICD-10-CM

## 2020-06-06 LAB — POCT GLYCOSYLATED HEMOGLOBIN (HGB A1C): Hemoglobin A1C: 5.5 % (ref 4.0–5.6)

## 2020-06-06 MED ORDER — FEXOFENADINE-PSEUDOEPHED ER 180-240 MG PO TB24: 1.0000 | ORAL_TABLET | Freq: Every day | ORAL | 1 refills | Status: DC

## 2020-06-06 MED ORDER — FLUTICASONE PROPIONATE 50 MCG/ACT NA SUSP: 2.0000 | Freq: Every day | NASAL | 6 refills | Status: DC

## 2020-06-06 NOTE — Patient Instructions (Signed)
Sndrome del tnel carpiano Carpal Tunnel Syndrome  El sndrome del tnel carpiano es una afeccin que causa dolor, debilidad y adormecimiento en la mano y los dedos. El adormecimiento es cuando no siente una zona del cuerpo. El tnel carpiano es un rea estrecha que se encuentra en el lado palmar de la mueca. Los movimientos repetidos de la mueca o determinadas enfermedades pueden causar hinchazn en el tnel. Esta hinchazn puede comprimir el nervio principal de la mueca. Este nervio se llama "nervio mediano". Cules son las causas? Esta afeccin puede ser causada por lo siguiente:  Mover la mano y la mueca una y otra vez mientras realiza una tarea.  Lesin en la mueca.  Artritis.  Un saco lleno de lquido (quiste) o un crecimiento anormal (tumor) en el tnel carpiano.  Acumulacin de lquido durante el embarazo.  Uso de herramientas que vibran. Algunas veces, la causa no se conoce. Qu incrementa el riesgo? Los siguientes factores pueden hacer que sea ms propenso a tener esta afeccin:  Tener un trabajo en el que deba hacer estas cosas: ? Mover la mano una y otra vez. ? Trabajar con herramientas que vibran, como taladros o lijadoras.  Ser mujer.  Tener diabetes, obesidad, problemas de tiroides o insuficiencia renal. Cules son los signos o sntomas? Los sntomas de esta afeccin incluyen:  Sensacin de hormigueo en los dedos.  Hormigueo o prdida de la sensibilidad de la mano.  Dolor en todo el brazo. Este dolor puede empeorar al flexionar la mueca y el codo durante mucho tiempo.  Dolor en la mueca que sube por el brazo hasta el hombro.  Dolor que baja hasta la palma de la mano o los dedos.  Debilidad en las manos. Puede resultarle difcil tomar y sostener objetos. Es posible que se sienta peor por la noche. Cmo se trata? El tratamiento de esta afeccin puede incluir:  Cambios en el estilo de vida. Se le pedir que deje o cambie la actividad que caus el  problema.  Hacer ejercicios y actividades para que los huesos, los msculos y los tendones se vuelvan ms fuertes (fisioterapia).  Aprender a usar la mano nuevamente (terapia ocupacional).  Medicamentos para el dolor y la hinchazn. Es posible que le apliquen inyecciones en la mueca.  Una frula o un dispositivo ortopdico para la mueca.  Ciruga. Siga estas instrucciones en su casa: Si tiene una frula o un dispositivo ortopdico:  Use la frula o el dispositivo ortopdico como se lo haya indicado el mdico. Quteselos solamente como se lo haya indicado el mdico.  Afloje la frula si los dedos: ? Hormiguean. ? Se adormecen. ? Se tornan fros y de color azul.  Mantenga la frula o el dispositivo ortopdico limpios.  Si la frula o el dispositivo ortopdico no son impermeables: ? No deje que se mojen. ? Cbralos con un envoltorio hermtico cuando tome un bao de inmersin o una ducha. Control del dolor, la rigidez y la hinchazn Si se lo indican, aplique hielo sobre la zona dolorida:  Si tiene un dispositivo ortopdico o una frula desmontable, quteselos como se lo haya indicado el mdico.  Ponga el hielo en una bolsa plstica.  Coloque una toalla entre la piel y la bolsa.  Coloque el hielo durante 20minutos, 2a3veces al da. No se quede dormido con la bolsa de hielo sobre la piel.  Retire el hielo si la piel se le pone de color rojo brillante. Esto es muy importante. Si no puede sentir dolor, calor o fro, tiene   un mayor riesgo de que se dae la zona. Mueva los dedos con frecuencia para reducir la rigidez y la hinchazn.   Instrucciones generales  Tome los medicamentos de venta libre y los recetados solamente como se lo haya indicado el mdico.  Descanse la mueca de cualquier actividad que le cause dolor. Si es necesario, hable con su jefe en el trabajo sobre los cambios que pueden ayudar a la curacin de la mueca.  Haga los ejercicios que le hayan indicado el  mdico, el fisioterapeuta o el terapeuta ocupacional.  Cumpla con todas las visitas de seguimiento. Comunquese con un mdico si:  Aparecen nuevos sntomas.  Los medicamentos no le alivian el dolor.  Sus sntomas empeoran. Solicite ayuda de inmediato si:  Tiene adormecimiento u hormigueo muy intensos en la mueca o la mano. Resumen  El sndrome del tnel carpiano es una afeccin que causa dolor en la mano y en el brazo.  Suele deberse a movimientos repetidos de la mueca.  Este problema se trata mediante cambios en el estilo de vida y medicamentos. La ciruga puede ser necesaria en casos muy graves.  Siga las instrucciones del mdico sobre el uso de una frula, el reposo de la mueca, la asistencia a las consultas de seguimiento y llamar para pedir ayuda. Esta informacin no tiene como fin reemplazar el consejo del mdico. Asegrese de hacerle al mdico cualquier pregunta que tenga. Document Revised: 06/29/2019 Document Reviewed: 06/29/2019 Elsevier Patient Education  2021 [employer]  

## 2020-06-06 NOTE — Progress Notes (Signed)
[place], is a 49 y.o. female  [id number]  DOB - 1971/01/02   Subjective:   Shelby Mccann is a 50 y.o. Hispanic female (interputor Fabio Bering [id number]  )  here today for acute seasonal allergy complaints with congestion,watery itchy eyes and cough 2 weeks. Patient has No chest pain, No abdominal pain - No Nausea, No new weakness tingling or numbness. Does have a Cough(nonproductive ) No - SOB. Denies/ Admits positive sick exposure or precipitating event.  Describes congestion as constant/ intermittent with no sputum.  Has tried Advil allergy with/ without relief.  Symptoms are made worse with out doors.  Reports/ denies previous symptoms in the past. Denies fever, chills, fatigue, sore throat, SOB, wheezing, chest pain, nausea, changes in bowel or bladder habits.  Hands bilateral pain for 6 months with fingers getting numb and burning. Phalen positive  and Tinel test negative  Allergies  Allergen Reactions  . Tessalon [Benzonatate]     Pt reported burning, itching on vaginal area    Past Medical History:  Diagnosis Date  . Allergy     Current Outpatient Medications on File Prior to Visit  Medication Sig Dispense Refill  . Multiple Vitamin (MULTIVITAMIN WITH MINERALS) TABS tablet Take 1 tablet by mouth daily.    . [DISCONTINUED] cetirizine (ZYRTEC) 10 MG tablet Take 1 tablet (10 mg total) by mouth daily. (Patient not taking: Reported on 05/01/2018) 30 tablet 11   Current Facility-Administered Medications on File Prior to Visit  Medication Dose Route Frequency Provider Last Rate Last Admin  . meclizine (ANTIVERT) tablet 25 mg  25 mg Oral TID PRN Kerin Perna, NP         Objective:   Vitals:   06/06/20 1027  BP: 105/62  Pulse: 76  Temp: (!) 97.5 F (36.4 C)  TempSrc: Temporal  SpO2: 99%  Weight: 167 lb 6.4 oz (75.9 kg)  Height: _0  (1.499 m)    Exam General appearance : Awake, alert, not in any distress. Speech Clear.  Not toxic looking HEENT: Atraumatic and Normocephalic, pupils equally reactive to light and accomodation Neck: Supple, no JVD. No cervical lymphadenopathy.  Chest: Good air entry bilaterally, no added sounds  CVS: S1 S2 regular, no murmurs.  Abdomen: Bowel sounds present, Non tender and not distended with no gaurding, rigidity or rebound. Extremities: B/L Lower Ext shows no edema, both legs are warm to touch Neurology: Awake alert, and oriented X 3, CN II-XII intact, Non focal Skin: No Rash   Assessment & Plan   Shelby Mccann was seen today for allergies and hand pain.  Diagnoses and all orders for this visit:  Screening for diabetes mellitus -     HgB A1c 5.5 per ADA guidelines patient is not a diabetic.  Did discuss prediabetes 5.7-6.4 and to start monitoring carbohydrates-rice, potatoes, tortillas, breads, sugars and sweets -     CBC with Differential  Class 1 obesity due to excess calories without serious comorbidity with body mass index (BMI) of 33.0 to 33.9 in adult Obesity is 30-39 indicating an excess in caloric intake or underlining conditions. This may lead to other co-morbidities. Lifestyle modifications of diet and exercise may reduce obesity.  -     Lipid Panel -     CMP14+EGFR  Other orders -     fexofenadine-pseudoephedrine (ALLEGRA-D 24) 180-240 MG 24 hr tablet; Take 1 tablet by mouth daily. -     fluticasone (FLONASE) 50 MCG/ACT nasal spray; Place 2 sprays  into both nostrils daily.  Environmental allergies -     CBC with Differential  Use Flonase nasal spray for at least duration of your allergy season.  - For appropriate administration of the nasal spray, clear the nose, use opposite hand for opposite nare, sniff gently, exhale through your mouth. - For maximal effect take these two nasal sprays at least 30 minutes apart. - Continue Allegra, Claritin or Zyrtec each day, as needed. - Drink at least 64 ounces of water each day. - If you have a humidifier use it  nightly. - Remove as many irritants/allergies as you are able to, no pets in the bedroom, change air filters in air vents. .  Allergic reaction can occur when your immune system reacts to a foreign substance -- such as pollen, bee venom or pet dander -- or a food that doesn't cause a reaction in most people.Your immune system produces substances known as antibodies. When you have allergies, your immune system makes antibodies that identify a particular allergen as harmful, even though it isn't. When you come into contact with the allergen, your immune system's reaction can inflame your skin, sinuses, airways or digestive system.  [email or web address]  Pain in both hands Hands bilateral pain for 6 months with fingers getting numb and burning. Phalen positive  and Tinel test negative Differential dx-carpal tunnel-does not want to see orthopedist at this time advised to wear a wrist brace to reduce the pain and may take over-the-counter Tylenol 500,000 mg every 4 hours and or ibuprofen 200x2 =400 mg every 6 hours as needed for pain   Return in about 6 months (around 12/07/2020) for health maintenace fasting labs.  The patient was given clear instructions to go to ER or return to medical center if symptoms don't improve, worsen or new problems develop. The patient verbalized understanding. The patient was told to call to get lab results if they haven't heard anything in the next week.   This note has been created with Surveyor, quantity. Any transcriptional errors are unintentional.   Kerin Perna, NP 06/10/2020, 10:10 PM

## 2020-06-06 NOTE — Progress Notes (Signed)
Pt complains of pressure and pain in hands- states its not pain but discomfort

## 2020-06-07 LAB — CBC WITH DIFFERENTIAL/PLATELET
Basophils Absolute: 0 10*3/uL (ref 0.0–0.2)
Basos: 1 %
EOS (ABSOLUTE): 0.1 10*3/uL (ref 0.0–0.4)
Eos: 2 %
Hematocrit: 40.6 % (ref 34.0–46.6)
Hemoglobin: 13.5 g/dL (ref 11.1–15.9)
Immature Grans (Abs): 0 10*3/uL (ref 0.0–0.1)
Immature Granulocytes: 0 %
Lymphocytes Absolute: 2.5 10*3/uL (ref 0.7–3.1)
Lymphs: 46 %
MCH: 28.2 pg (ref 26.6–33.0)
MCHC: 33.3 g/dL (ref 31.5–35.7)
MCV: 85 fL (ref 79–97)
Monocytes Absolute: 0.4 10*3/uL (ref 0.1–0.9)
Monocytes: 8 %
Neutrophils Absolute: 2.4 10*3/uL (ref 1.4–7.0)
Neutrophils: 43 %
Platelets: 325 10*3/uL (ref 150–450)
RBC: 4.78 x10E6/uL (ref 3.77–5.28)
RDW: 14.3 % (ref 11.7–15.4)
WBC: 5.5 10*3/uL (ref 3.4–10.8)

## 2020-06-07 LAB — CMP14+EGFR
ALT: 20 IU/L (ref 0–32)
AST: 18 IU/L (ref 0–40)
Albumin/Globulin Ratio: 2 (ref 1.2–2.2)
Albumin: 4.5 g/dL (ref 3.8–4.9)
Alkaline Phosphatase: 93 IU/L (ref 44–121)
BUN/Creatinine Ratio: 22 (ref 9–23)
BUN: 14 mg/dL (ref 6–24)
Bilirubin Total: 0.4 mg/dL (ref 0.0–1.2)
CO2: 25 mmol/L (ref 20–29)
Calcium: 9.7 mg/dL (ref 8.7–10.2)
Chloride: 101 mmol/L (ref 96–106)
Creatinine, Ser: 0.64 mg/dL (ref 0.57–1.00)
Globulin, Total: 2.2 g/dL (ref 1.5–4.5)
Glucose: 88 mg/dL (ref 65–99)
Potassium: 4.3 mmol/L (ref 3.5–5.2)
Sodium: 141 mmol/L (ref 134–144)
Total Protein: 6.7 g/dL (ref 6.0–8.5)
eGFR: 107 mL/min/{1.73_m2} (ref >59)

## 2020-06-07 LAB — LIPID PANEL
Chol/HDL Ratio: 3.5 ratio (ref 0.0–4.4)
Cholesterol, Total: 178 mg/dL (ref 100–199)
HDL: 51 mg/dL (ref >39)
LDL Chol Calc (NIH): 102 mg/dL — ABNORMAL HIGH (ref 0–99)
Triglycerides: 144 mg/dL (ref 0–149)
VLDL Cholesterol Cal: 25 mg/dL (ref 5–40)

## 2020-06-13 ENCOUNTER — Encounter (INDEPENDENT_AMBULATORY_CARE_PROVIDER_SITE_OTHER): Payer: Self-pay

## 2020-06-13 ENCOUNTER — Telehealth (INDEPENDENT_AMBULATORY_CARE_PROVIDER_SITE_OTHER): Payer: Self-pay

## 2020-06-13 NOTE — Telephone Encounter (Signed)
-----   Message from Grayce Sessions, NP sent at 06/13/2020  9:51 AM EDT ----- Labs are  normal. Make sure you are drinking at least 48 oz of water per day. Work on eating a low fat, heart healthy diet and participate in regular aerobic exercise program to control as well. Exercise at least  30 minutes per day-5 days per week. Avoid red meat. No fried foods. No junk foods, sodas, sugary foods or drinks, unhealthy snacking, alcohol or smoking.

## 2020-06-13 NOTE — Telephone Encounter (Signed)
Call placed to patient using pacific interpreter [phone]) left message asking patient to return call to [facility] at [phone]. Normal result letter mailed to patient. Maryjean Morn, CMA

## 2020-07-11 LAB — GLUCOSE, POCT (MANUAL RESULT ENTRY): POC Glucose: 92 mg/dl (ref 70–99)

## 2020-12-08 ENCOUNTER — Ambulatory Visit (INDEPENDENT_AMBULATORY_CARE_PROVIDER_SITE_OTHER): Payer: Self-pay | Admitting: Primary Care

## 2021-02-06 ENCOUNTER — Encounter (HOSPITAL_COMMUNITY): Payer: Self-pay

## 2021-02-06 ENCOUNTER — Ambulatory Visit (HOSPITAL_COMMUNITY)
Admission: EM | Admit: 2021-02-06 | Discharge: 2021-02-06 | Disposition: A | Discharge status: Home / Self Care | Payer: Self-pay | Attending: Urgent Care | Admitting: Urgent Care

## 2021-02-06 ENCOUNTER — Other Ambulatory Visit: Payer: Self-pay

## 2021-02-06 DIAGNOSIS — J3089 Other allergic rhinitis: Secondary | ICD-10-CM

## 2021-02-06 DIAGNOSIS — R052 Subacute cough: Secondary | ICD-10-CM

## 2021-02-06 DIAGNOSIS — J018 Other acute sinusitis: Secondary | ICD-10-CM

## 2021-02-06 MED ORDER — AMOXICILLIN-POT CLAVULANATE 875-125 MG PO TABS
1.0000 | ORAL_TABLET | Freq: Two times a day (BID) | ORAL | 0 refills | Status: DC
  Filled 2021-02-06: qty 14, 7d supply, fill #0

## 2021-02-06 MED ORDER — PREDNISONE 20 MG PO TABS
ORAL_TABLET | ORAL | 0 refills | Status: DC
  Filled 2021-02-06: qty 10, 5d supply, fill #0

## 2021-02-06 MED ORDER — CETIRIZINE HCL 10 MG PO TABS
10.0000 mg | ORAL_TABLET | Freq: Every day | ORAL | 0 refills | Status: AC
  Filled 2021-02-06: qty 30, 30d supply, fill #0

## 2021-02-06 MED ORDER — PROMETHAZINE-DM 6.25-15 MG/5ML PO SYRP
5.0000 mL | ORAL_SOLUTION | Freq: Every evening | ORAL | 0 refills | Status: DC | PRN
  Filled 2021-02-06: qty 100, 20d supply, fill #0

## 2021-02-06 NOTE — ED Provider Notes (Signed)
[place]   MRN: [id number] DOB: 03-29-1970  Subjective:   Shelby Mccann is a 51 y.o. female presenting for 8-day history of persistent sinus congestion, sinus pain, facial pain, coughing that elicits chest pain.  Patient picked up some amoxicillin from [place].  Shelby Mccann has been taking this for the past 2 days.  Shelby Mccann has a history of allergic rhinitis but does not take anything for it.  Shelby Mccann is supposed to be taking Allegra and Flonase but does not.  No shortness of breath or wheezing.  Patient is not a smoker.  No history of asthma or sarcoidosis.  No body aches, fevers.  Patient is otherwise healthy, no history of diabetes.   Current Facility-Administered Medications:    meclizine (ANTIVERT) tablet 25 mg, 25 mg, Oral, TID PRN, Kerin Perna, NP  Current Outpatient Medications:    fexofenadine-pseudoephedrine (ALLEGRA-D 24) 180-240 MG 24 hr tablet, Take 1 tablet by mouth daily., Disp: 30 tablet, Rfl: 1   fluticasone (FLONASE) 50 MCG/ACT nasal spray, Place 2 sprays into both nostrils daily., Disp: 16 g, Rfl: 6   Multiple Vitamin (MULTIVITAMIN WITH MINERALS) TABS tablet, Take 1 tablet by mouth daily., Disp: , Rfl:    Allergies  Allergen Reactions   Tessalon [Benzonatate]     Pt reported burning, itching on vaginal area    Past Medical History:  Diagnosis Date   Allergy      Past Surgical History:  Procedure Laterality Date   ABDOMINAL HYSTERECTOMY  2013    uterus fell    CESAREAN SECTION      Family History  Problem Relation Age of Onset   Cancer Mother        utetrine cancer    Cancer Brother     Social History   Tobacco Use   Smoking status: Never   Smokeless tobacco: Never  Vaping Use   Vaping Use: Never used  Substance Use Topics   Alcohol use: No   Drug use: No    ROS   Objective:   Vitals: BP 120/81 (BP Location: Right Arm)    Pulse 96    Temp 98.5 F (36.9 C) (Oral)    Resp 17    LMP 01/26/2011    SpO2 96%   Physical  Exam Constitutional:      General: Shelby Mccann is not in acute distress.    Appearance: Normal appearance. Shelby Mccann is well-developed and normal weight. Shelby Mccann is not ill-appearing, toxic-appearing or diaphoretic.  HENT:     Head: Normocephalic and atraumatic.     Right Ear: Tympanic membrane, ear canal and external ear normal. No drainage or tenderness. No middle ear effusion. There is no impacted cerumen. Tympanic membrane is not erythematous.     Left Ear: Tympanic membrane, ear canal and external ear normal. No drainage or tenderness.  No middle ear effusion. There is no impacted cerumen. Tympanic membrane is not erythematous.     Nose: Congestion present. No rhinorrhea.     Comments: Bilateral maxillary sinus tenderness.    Mouth/Throat:     Mouth: Mucous membranes are moist. No oral lesions.     Pharynx: No pharyngeal swelling, oropharyngeal exudate, posterior oropharyngeal erythema or uvula swelling.     Tonsils: No tonsillar exudate or tonsillar abscesses.     Comments: Significant postnasal drainage overlying pharynx. Eyes:     General: No scleral icterus.       Right eye: No discharge.        Left  eye: No discharge.     Extraocular Movements: Extraocular movements intact.     Right eye: Normal extraocular motion.     Left eye: Normal extraocular motion.     Conjunctiva/sclera: Conjunctivae normal.  Cardiovascular:     Rate and Rhythm: Normal rate and regular rhythm.     Pulses: Normal pulses.     Heart sounds: Normal heart sounds. No murmur heard.   No friction rub. No gallop.  Pulmonary:     Effort: Pulmonary effort is normal. No respiratory distress.     Breath sounds: Normal breath sounds. No stridor. No wheezing, rhonchi or rales.  Musculoskeletal:     Cervical back: Normal range of motion and neck supple.  Lymphadenopathy:     Cervical: No cervical adenopathy.  Skin:    General: Skin is warm and dry.     Findings: No rash.  Neurological:     General: No focal deficit present.      Mental Status: Shelby Mccann is alert and oriented to person, place, and time.  Psychiatric:        Mood and Affect: Mood normal.        Behavior: Behavior normal.        Thought Content: Thought content normal.    Assessment and Plan :   PDMP not reviewed this encounter.  1. Acute non-recurrent sinusitis of other sinus   2. Subacute cough   3. Allergic rhinitis due to other allergic trigger, unspecified seasonality    Advised patient to not take any medications that are not prescribed to her. Will start empiric treatment for sinusitis with Augmentin.  Will also use a prednisone course in the setting of uncontrolled allergic rhinitis.  Use supportive care otherwise.   Deferred imaging given clear cardiopulmonary exam, hemodynamically stable vital signs. Counseled patient on potential for adverse effects with medications prescribed/recommended today, ER and return-to-clinic precautions discussed, patient verbalized understanding.    Jaynee Eagles, PA-C 02/06/21 1227

## 2021-02-06 NOTE — ED Triage Notes (Signed)
Pt presents with non productive cough, congestion, and sore throat X 8 days.

## 2021-07-14 ENCOUNTER — Other Ambulatory Visit: Payer: Self-pay

## 2022-03-31 ENCOUNTER — Ambulatory Visit: Payer: Self-pay | Admitting: Internal Medicine

## 2022-04-26 ENCOUNTER — Ambulatory Visit: Payer: Self-pay | Admitting: Internal Medicine

## 2022-05-11 ENCOUNTER — Ambulatory Visit: Payer: Self-pay | Admitting: Internal Medicine

## 2022-05-14 ENCOUNTER — Encounter: Payer: Self-pay | Admitting: Internal Medicine

## 2022-05-14 ENCOUNTER — Ambulatory Visit: Payer: Self-pay | Admitting: Internal Medicine

## 2022-05-14 VITALS — BP 144/92 | HR 84 | Resp 16 | Ht <= 58 in | Wt 169.0 lb

## 2022-05-14 DIAGNOSIS — M940 Chondrocostal junction syndrome [Tietze]: Secondary | ICD-10-CM

## 2022-05-14 DIAGNOSIS — F4321 Adjustment disorder with depressed mood: Secondary | ICD-10-CM

## 2022-05-14 NOTE — Patient Instructions (Signed)
[facility]:  [phone] 

## 2022-05-14 NOTE — Progress Notes (Signed)
Subjective:    Patient ID: Shelby Mccann, female   DOB: Mar 20, 1970, 52 y.o.   MRN: [id number]   HPI  Here to establish  [name] inteprets  Chest pain:    Points to right upper sternal area and radiates up and over anterior shoulder to upper right back.  States the pain comes and goes.  Really hurts if she cleans her home all in one day.   History of costochondritis.  Has been seen for it multiple times since 2019.  Has never received PT for the pain.   Did go to the chiropractor for a couple of visits.  She does not feel it helped.  She does state the chiropractor sent her for xrays and was the one to diagnose her with costochondritis. She does have gentle ROM exercises that hurt to do, but do seem to help.   No oral meds, including prednisone, have improved her pain long term.  They do help while she is taking.  Prior to being diagnosed with costochondritis, she states was evaluated for PE and cardiac etiology.  All of that was negative.  Appears this was in October os 2019 at [facility] ED and [facility] ED.  2.  Grief:  Husband died 3 days ago.  He had massive MI at age of 59.  They have 3 sons, ages 1-34.    Current Meds  Medication Sig   cetirizine (ZYRTEC ALLERGY) 10 MG tablet Take 1 tablet (10 mg total) by mouth daily.   Multiple Vitamin (MULTIVITAMIN WITH MINERALS) TABS tablet Take 1 tablet by mouth daily.   Allergies  Allergen Reactions   Tessalon [Benzonatate]     Pt reported burning, itching on vaginal area   Past Medical History:  Diagnosis Date   Allergy    Past Surgical History:  Procedure Laterality Date   ABDOMINAL HYSTERECTOMY  2013    uterus fell    CESAREAN SECTION     Family History  Problem Relation Age of Onset   Cancer Mother        utetrine cancer    Cancer Brother    Social History   Socioeconomic History   Marital status: Married    Spouse name: Not on file   Number of children: Not on file   Years of education: Not on file   Highest  education level: Not on file  Occupational History   Not on file  Tobacco Use   Smoking status: Never   Smokeless tobacco: Never  Vaping Use   Vaping Use: Never used  Substance and Sexual Activity   Alcohol use: No   Drug use: No   Sexual activity: Yes  Other Topics Concern   Not on file  Social History Narrative   ** Merged History Encounter **       Social Determinants of Health   Financial Resource Strain: Not on file  Food Insecurity: No Food Insecurity (05/17/2022)   Hunger Vital Sign    Worried About Running Out of Food in the Last Year: Never true    Ran Out of Food in the Last Year: Never true  Transportation Needs: No Transportation Needs (05/17/2022)   PRAPARE - Administrator, Civil Service (Medical): No    Lack of Transportation (Non-Medical): No  Physical Activity: Not on file  Stress: Not on file  Social Connections: Not on file  Intimate Partner Violence: Not At Risk (05/17/2022)   Humiliation, Afraid, Rape, and Kick questionnaire  Fear of Current or Ex-Partner: No    Emotionally Abused: No    Physically Abused: No    Sexually Abused: No     Review of Systems    Objective:   BP (!) 144/92 (BP Location: Left Arm, Patient Position: Sitting, Cuff Size: Normal)   Pulse 84   Resp 16   Ht 4\' 9"  (1.448 m)   Wt 169 lb (76.7 kg)   LMP 01/26/2011   BMI 36.57 kg/m   Physical Exam NAD HEENT:  PERRL, EOMI, TMs pearly gray, throat without injection Neck:  Supple, No adenopathy, no thyromegaly Chest:  CTA.  Very tender over high right costochondral margin and extending up pectoral muscles to trap on right.   CV:  RRR with normal S1 and S2, No S3, S4 or murmur.  No carotid bruits.  Carotid, radial and DP pulses normal and equal Abd;  S, NT, No HSm or mass, + BS LE:  No edema.   Assessment & Plan   Chronic recurrent right costochondritis with radiating muscle tenderness:  Can't do stretches given today as too painful. Did not want  medication. Referral to [facility] PT.   Sed rate and TSH with labs in 2 weeks.    2.  Food access/social needs following loss of husband and bread winner:  CHW/HOTeam.  Also, LCSWA for counseling if needed with grief.  3.  Elevated BP:  BP check with upcoming labs  Follow up in 2 weeks for fasting labs

## 2022-05-17 NOTE — Clinical Social Work Note (Signed)
SDOH Screenings   Food Insecurity: No Food Insecurity (05/17/2022)  Housing: Low Risk  (05/17/2022)  Transportation Needs: No Transportation Needs (05/17/2022)  Utilities: Not At Risk (05/17/2022)  Depression (PHQ2-9): High Risk (05/17/2022)  Tobacco Use: Low Risk  (05/14/2022)       05/17/2022    3:18 PM 06/06/2020   10:26 AM 02/13/2018    8:51 AM 01/06/2018   10:56 AM  GAD 7 : Generalized Anxiety Score  Nervous, Anxious, on Edge 3 0 0 0  Control/stop worrying 3 0 0 0  Worry too much - different things 0 0 0 0  Trouble relaxing 3 0 0 0  Restless 3 0 0 0  Easily annoyed or irritable 0 0 0 0  Afraid - awful might happen 0 0 0 0  Total GAD 7 Score 12 0 0 0  Anxiety Difficulty Very difficult Not difficult at all       Flowsheet Row Office Visit from 05/14/2022 in [facility]  PHQ-9 Total Score 11     Patient informed Dewayne Hatch that she lost her husband last week. She was very tearful during visit. She is interested in counseling but said she needed another week or two just to continue get things in order and then I could call to set up an appointment. Dr. Delrae Alfred was informed.

## 2022-05-26 DIAGNOSIS — R7303 Prediabetes: Secondary | ICD-10-CM

## 2022-05-26 HISTORY — DX: Prediabetes: R73.03

## 2022-05-31 ENCOUNTER — Other Ambulatory Visit: Payer: Self-pay

## 2022-06-11 ENCOUNTER — Telehealth: Payer: Self-pay

## 2022-06-11 NOTE — Telephone Encounter (Signed)
Patient would like an appointment, patient is having earache on her right ear and pain runs down to her shoulder, it bothers her when she lay down on that side. Patient has taken medicine for pain but ear still bothers.   Will call patient when there is a cancellation.

## 2022-06-14 ENCOUNTER — Other Ambulatory Visit: Payer: Self-pay

## 2022-06-14 DIAGNOSIS — M940 Chondrocostal junction syndrome [Tietze]: Secondary | ICD-10-CM

## 2022-06-14 DIAGNOSIS — Z7689 Persons encountering health services in other specified circumstances: Secondary | ICD-10-CM

## 2022-06-15 LAB — COMPREHENSIVE METABOLIC PANEL WITH GFR
ALT: 16 IU/L (ref 0–32)
AST: 15 IU/L (ref 0–40)
Albumin/Globulin Ratio: 1.7 (ref 1.2–2.2)
Albumin: 4.2 g/dL (ref 3.8–4.9)
Alkaline Phosphatase: 104 IU/L (ref 44–121)
BUN/Creatinine Ratio: 21 (ref 9–23)
BUN: 13 mg/dL (ref 6–24)
Bilirubin Total: 0.3 mg/dL (ref 0.0–1.2)
CO2: 24 mmol/L (ref 20–29)
Calcium: 9.7 mg/dL (ref 8.7–10.2)
Chloride: 105 mmol/L (ref 96–106)
Creatinine, Ser: 0.62 mg/dL (ref 0.57–1.00)
Globulin, Total: 2.5 g/dL (ref 1.5–4.5)
Glucose: 89 mg/dL (ref 70–99)
Potassium: 4.2 mmol/L (ref 3.5–5.2)
Sodium: 141 mmol/L (ref 134–144)
Total Protein: 6.7 g/dL (ref 6.0–8.5)
eGFR: 106 mL/min/1.73 (ref >59)

## 2022-06-15 LAB — CBC WITH DIFFERENTIAL/PLATELET
Basophils Absolute: 0 10*3/uL (ref 0.0–0.2)
Basos: 1 %
EOS (ABSOLUTE): 0.2 10*3/uL (ref 0.0–0.4)
Eos: 3 %
Hematocrit: 40.2 % (ref 34.0–46.6)
Hemoglobin: 13.5 g/dL (ref 11.1–15.9)
Immature Grans (Abs): 0 10*3/uL (ref 0.0–0.1)
Immature Granulocytes: 0 %
Lymphocytes Absolute: 2.1 10*3/uL (ref 0.7–3.1)
Lymphs: 38 %
MCH: 28.1 pg (ref 26.6–33.0)
MCHC: 33.6 g/dL (ref 31.5–35.7)
MCV: 84 fL (ref 79–97)
Monocytes Absolute: 0.4 10*3/uL (ref 0.1–0.9)
Monocytes: 6 %
Neutrophils Absolute: 2.9 10*3/uL (ref 1.4–7.0)
Neutrophils: 52 %
Platelets: 334 10*3/uL (ref 150–450)
RBC: 4.8 x10E6/uL (ref 3.77–5.28)
RDW: 13.3 % (ref 11.7–15.4)
WBC: 5.5 10*3/uL (ref 3.4–10.8)

## 2022-06-15 LAB — LIPID PANEL W/O CHOL/HDL RATIO
Cholesterol, Total: 191 mg/dL (ref 100–199)
HDL: 43 mg/dL (ref >39)
LDL Chol Calc (NIH): 113 mg/dL — ABNORMAL HIGH (ref 0–99)
Triglycerides: 200 mg/dL — ABNORMAL HIGH (ref 0–149)
VLDL Cholesterol Cal: 35 mg/dL (ref 5–40)

## 2022-06-15 LAB — HEMOGLOBIN A1C
Est. average glucose Bld gHb Est-mCnc: 126 mg/dL
Hgb A1c MFr Bld: 6 % — ABNORMAL HIGH (ref 4.8–5.6)

## 2022-06-15 LAB — TSH: TSH: 2.77 u[IU]/mL (ref 0.450–4.500)

## 2022-06-15 LAB — SEDIMENTATION RATE: Sed Rate: 31 mm/h (ref 0–40)

## 2022-07-05 NOTE — Telephone Encounter (Signed)
Patient has been scheduled

## 2022-07-07 ENCOUNTER — Encounter: Payer: Self-pay | Admitting: Internal Medicine

## 2022-07-07 ENCOUNTER — Other Ambulatory Visit: Payer: Self-pay

## 2022-07-07 ENCOUNTER — Ambulatory Visit: Payer: Self-pay | Admitting: Internal Medicine

## 2022-07-07 VITALS — BP 110/84 | HR 94 | Resp 16 | Ht <= 58 in | Wt 173.0 lb

## 2022-07-07 DIAGNOSIS — L811 Chloasma: Secondary | ICD-10-CM | POA: Insufficient documentation

## 2022-07-07 DIAGNOSIS — H6121 Impacted cerumen, right ear: Secondary | ICD-10-CM

## 2022-07-07 DIAGNOSIS — F4321 Adjustment disorder with depressed mood: Secondary | ICD-10-CM | POA: Insufficient documentation

## 2022-07-07 DIAGNOSIS — E782 Mixed hyperlipidemia: Secondary | ICD-10-CM

## 2022-07-07 DIAGNOSIS — Z599 Problem related to housing and economic circumstances, unspecified: Secondary | ICD-10-CM

## 2022-07-07 DIAGNOSIS — M940 Chondrocostal junction syndrome [Tietze]: Secondary | ICD-10-CM

## 2022-07-07 DIAGNOSIS — R7303 Prediabetes: Secondary | ICD-10-CM

## 2022-07-07 MED ORDER — TRIAMCINOLONE ACETONIDE 0.1 % EX CREA
TOPICAL_CREAM | CUTANEOUS | 1 refills | Status: AC
  Filled 2022-07-07: qty 80, 25d supply, fill #0

## 2022-07-07 NOTE — Progress Notes (Signed)
Subjective:    Patient ID: Shelby Mccann, female   DOB: 04-07-1970, 52 y.o.   MRN: [id number]   HPI  [name] interprets   Food access and financial concerns case management:  states she never heard from anyone CHW or LCSWA.  She denies screening phone calls.  2.  Costochondritis and right upper chest/back pain:  Never heard from PT.  She has been taking Artibion, which I believe has Diclofenac.  She took one dose yesterday and this is the only thing that has helped.  3.  Financial difficulties:  will need rent support--church has helped out, but believes this was the last month.   She is cleaning houses, but needs more homes to clean.    4.  Pain near her right ear--actually points to her SCM muscle.  For 3 weeks.  No associated headaches.  She did not pay attention to the pain level when taking the Artibion.  5.  Skin lesions:  at right extensor elbow and left anterior lateral  thigh.  She never gets blisters with these and has had for years.  The thigh lesions go away with cortisone cream, but then return.  The lesions on her right elbow may have white plaque on them at times.    6.  Darkened areas of bilateral face cheeks.  Has had for years.  She cannot recall if started with pregnancy.   Current Meds  Medication Sig   cetirizine (ZYRTEC ALLERGY) 10 MG tablet Take 1 tablet (10 mg total) by mouth daily.   UNABLE TO FIND daily. Med Name: Artibion for joint pain  Artibion I believe has Diclofenac mixed with Vitamin B complex, but unable to find this listed on line.    Allergies  Allergen Reactions   Tessalon [Benzonatate]     Pt reported burning, itching on vaginal area     Review of Systems    Objective:   BP 110/84 (BP Location: Right Arm, Patient Position: Sitting, Cuff Size: Normal)   Pulse 94   Resp 16   Ht 4\' 9"  (1.448 m)   Wt 173 lb (78.5 kg)   LMP 01/26/2011   BMI 37.44 kg/m   Physical Exam HEENT:  PERRL, EOMI, Right ear canal with cerumen  impaction.  Left clear with pearly gray TM.  Throat without injection  Skin on cheeks with ill defined hyperpigmentation Neck:  tender along SCM muscle on right to mastoid and down to clavicle insertion.  No adenoapthy Chest:  CTA CV:  RRR Flat raised denuded lesions on extensor surface of right elbow.  Dry 3 mm circular flaking lesions (almost as would expect healing vesicular rash) at lateral anterior left thigh.     Assessment & Plan   Costochondritis:  Will check with [facility].  She has a medication from [place] that likely has Diclofenac, which has helped.  Take twice daily with food for 2 weeks, then only as needed.    2.  Right SCM muscle spasm and tenderness:  medication as above  3.  Right cerumen impaction:  Debrox twice daily for 2 days then irrigate with warm water/bulb suction.  If successful, 4 drops of 50:50 mix of white vinegar and rubbing alcohol in and out.  Call if cannot get cleaned out.  4.  Grief and Financial barriers with sudden loss of husband:  warm hand off to [employer], [employer], and Amparo Bristol, CHW.    5.  Skin lesions:  on elbow, may be warts or even psoriasis--need her to leave it alone so can get a better idea in 2 weeks as walk in.  Left ant thigh:  appears to be dry skin or eczema:  Dove soap, Triamcinolone cream twice daily and cover with Eucerin cream for eczema relief.    6.  Melasma:  sun block and skin fading creams locally.  7.  Prediabetes and hyperlipidemia;  Discussed working on dietary goals and physical activity.

## 2022-07-07 NOTE — Patient Instructions (Addendum)
Debrox 4 gotas oido de [place] al dia para 2 dias.  Limpia con agua tibia  50:50 mezcl de alcol y vinagra 4-5 gotas en su oido  Tome un vaso de agua antes de cada comida Tome un minimo de 6 a 8 vasos de agua diarios Coma tres veces al dia Coma una proteina y [name] grasa saludable con comida.  (huevos, pescado, pollo, pavo, y limite carnes rojas Coma 5 porciones diarias de legumbres.  Mezcle los colores Coma 2 porciones diarias de frutas con cascara cuando sea comestible Use platos pequeos Suelte su tenedor o cuchara despues de cada mordida hata que se mastique y se trague Come en la mesa con amigos o familiares por lo menos una vez al dia [place]ague la televisin y aparatos electrnicos durante la comida  Su objetivo debe ser perder una libra por semana  Estudios recientes indican que las personas quienes consumen todos de sus calorias durante 12 horas se bajan de pesocon Mas eficiencia.  Por ejemplo, si Usted come su primera comida a las 7:00 a.m., su comida final del dia se debe completar antes de las 7:00 p.m.

## 2022-07-09 ENCOUNTER — Other Ambulatory Visit: Payer: Self-pay

## 2022-08-13 ENCOUNTER — Other Ambulatory Visit: Payer: Self-pay | Admitting: Psychology

## 2022-08-13 ENCOUNTER — Ambulatory Visit: Payer: Self-pay | Admitting: Psychology

## 2022-08-13 NOTE — Progress Notes (Signed)
CASE MANAGEMENT Today we discussed the client starting counseling. She made an appointment for next Friday.   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 The client gave [place] some business cards and we are going to find places that they can be given out.   The son was referred to [facility] for help from the career center. They will help with things like resume prep, interview prep, clothes for the interview, and possibly to even be trained in a specialty field.   The family shopped the market and took some food with them.  We will continue to follow up.

## 2022-08-20 ENCOUNTER — Ambulatory Visit: Payer: Self-pay | Admitting: Psychology

## 2022-08-20 DIAGNOSIS — Z599 Problem related to housing and economic circumstances, unspecified: Secondary | ICD-10-CM

## 2022-09-07 ENCOUNTER — Ambulatory Visit: Payer: Self-pay | Admitting: Internal Medicine

## 2022-09-07 ENCOUNTER — Encounter: Payer: Self-pay | Admitting: Internal Medicine

## 2022-09-07 VITALS — BP 114/78 | HR 66 | Resp 16 | Ht <= 58 in | Wt 169.0 lb

## 2022-09-07 DIAGNOSIS — H547 Unspecified visual loss: Secondary | ICD-10-CM

## 2022-09-07 DIAGNOSIS — Z9189 Other specified personal risk factors, not elsewhere classified: Secondary | ICD-10-CM

## 2022-09-07 DIAGNOSIS — Z0000 Encounter for general adult medical examination without abnormal findings: Secondary | ICD-10-CM

## 2022-09-07 DIAGNOSIS — Z1231 Encounter for screening mammogram for malignant neoplasm of breast: Secondary | ICD-10-CM

## 2022-09-07 DIAGNOSIS — R7303 Prediabetes: Secondary | ICD-10-CM

## 2022-09-07 NOTE — Patient Instructions (Signed)
Tome un vaso de agua antes de cada comida Tome un minimo de 6 a 8 vasos de agua diarios Coma tres veces al dia Coma una proteina y una grasa saludable con comida.  (huevos, pescado, pollo, pavo, y limite carnes rojas Coma 5 porciones diarias de legumbres.  Mezcle los colores Coma 2 porciones diarias de frutas con cascara cuando sea comestible Use platos pequeos Suelte su tenedor o cuchara despues de cada mordida hata que se mastique y se trague Come en la mesa con amigos o familiares por lo menos una vez al dia [place] la televisin y aparatos electrnicos durante la comida  Su objetivo debe ser perder una libra por semana  Estudios recientes indican que las personas quienes consumen todos de sus calorias durante 12 horas se bajan de pesocon Mas eficiencia.  Por ejemplo, si Usted come su primera comida a las 7:00 a.m., su comida final del dia se debe completar antes de las 7:00 p.m. 

## 2022-09-07 NOTE — Progress Notes (Signed)
Subjective:    Patient ID: Shelby Mccann, female   DOB: 10-Feb-1970, 52 y.o.   MRN: [id number]   HPI  See written record--internet down.  [name] interprets  CPE without pap  1.  Pap:  Last in 05-Jan-2012 before [facility] for uterine prolapse.  Always normal  2.  Mammogram:  Last in 2015/01/05 and normal.  No family history of breast cancer.    3.  Osteoprevention:  Little dairy.  Some walking.  4.  Guaiac Cards/FIT:  Never.    5.  Colonoscopy:  Never.  No family history of colon cancer.    6.  Immunizations:  Has not had Shingrix, recent COVID or influenza.  7.  Glucose/Cholesterol:  Prediabetes with A1C in May at 6.0%.  Dyslipidemia with elevated trigs and low HDL.   Lipid Panel     Component Value Date/Time   CHOL 191 06/14/2022 1414   TRIG 200 (H) 06/14/2022 1414   HDL 43 06/14/2022 1414   CHOLHDL 3.5 06/06/2020 1129   CHOLHDL 3.6 01/07/2014 0910   VLDL 27 01/07/2014 0910   LDLCALC 113 (H) 06/14/2022 1414   LABVLDL 35 06/14/2022 1414     Current Meds  Medication Sig   cetirizine (ZYRTEC ALLERGY) 10 MG tablet Take 1 tablet (10 mg total) by mouth daily.   Multiple Vitamin (MULTIVITAMIN WITH MINERALS) TABS tablet Take 1 tablet by mouth daily.   triamcinolone cream (KENALOG) 0.1 % Apply to affected area twice daily as needed   Allergies  Allergen Reactions   Tessalon [Benzonatate]     Pt reported burning, itching on vaginal area   Past Medical History:  Diagnosis Date   Allergy    Prediabetes 05/2022   Past Surgical History:  Procedure Laterality Date   ABDOMINAL HYSTERECTOMY  01/26/2011   uterine prolapse   CESAREAN SECTION     Family History  Problem Relation Age of Onset   Cancer Mother        uterine cancer   Breast cancer Neg Hx    Social History   Socioeconomic History   Marital status: Widowed    Spouse name: Not on file   Number of children: 3   Years of education: Not on file   Highest education level: Not on file  Occupational History    Occupation: housecleaning--owns business  Tobacco Use   Smoking status: Never    Passive exposure: Never   Smokeless tobacco: Never  Vaping Use   Vaping status: Never Used  Substance and Sexual Activity   Alcohol use: No   Drug use: No   Sexual activity: Not Currently    Birth control/protection: Surgical    Comment: Total hyster  Other Topics Concern   Not on file  Social History Narrative   Originally from [place]   Husband died in past year 01-05-2023   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   Lives with her eldest and youngest sons.   Middle son in [place]   Social Determinants of Health   Financial Resource Strain: Not on file  Food Insecurity: No Food Insecurity (11/09/2022)   Hunger Vital Sign    Worried About Running Out of Food in the Last Year: Never true    Ran Out of Food in the Last Year: Never true  Transportation Needs: No Transportation Needs (11/09/2022)   PRAPARE - Administrator, Civil Service (Medical): No    Lack of Transportation (Non-Medical): No  Physical Activity: Not  on file  Stress: Not on file  Social Connections: Unknown (06/09/2021)   Received from [facility], [facility]   Social Network    Social Network: Not on file  Intimate Partner Violence: Not At Risk (05/17/2022)   Humiliation, Afraid, Rape, and Kick questionnaire    Fear of Current or Ex-Partner: No    Emotionally Abused: No    Physically Abused: No    Sexually Abused: No     Review of Systems    Objective:   BP 114/78 (BP Location: Left Arm, Patient Position: Sitting, Cuff Size: Normal)   Pulse 66   Resp 16   Ht 4\' 10"  (1.473 m)   Wt 169 lb (76.7 kg)   LMP 01/26/2011   BMI 35.32 kg/m   Physical Exam Constitutional:      Appearance: She is obese.  HENT:     Right Ear: Tympanic membrane normal.     Left Ear: Tympanic membrane normal.     Ears:     Comments: Cerumen bilaterally, but not occluding canal.    Nose: Nose normal.      Mouth/Throat:     Mouth: Mucous membranes are moist.     Pharynx: Oropharynx is clear.  Eyes:     Extraocular Movements: Extraocular movements intact.     Conjunctiva/sclera: Conjunctivae normal.     Pupils: Pupils are equal, round, and reactive to light.     Comments: Discs sharp  Neck:     Thyroid: No thyroid mass or thyromegaly.  Cardiovascular:     Rate and Rhythm: Normal rate and regular rhythm.     Heart sounds: S1 normal and S2 normal. No murmur heard.    No friction rub. No S3 or S4 sounds.     Comments: No carotid bruits.  Carotid, radial, femoral, DP and PT pulses normal and equal.   Pulmonary:     Effort: Pulmonary effort is normal.     Breath sounds: Normal breath sounds and air entry.  Chest:  Breasts:    Right: No inverted nipple, mass or nipple discharge.     Left: No inverted nipple, mass or nipple discharge.  Abdominal:     General: Bowel sounds are normal.     Palpations: Abdomen is soft. There is no hepatomegaly, splenomegaly or mass.     Tenderness: There is no abdominal tenderness.     Hernia: No hernia is present.  Genitourinary:    Comments: Normal external female genitalia No uterine area or adnexal mass or tenderness.   Musculoskeletal:        General: Normal range of motion.     Cervical back: Normal range of motion and neck supple.     Right lower leg: No edema.     Left lower leg: No edema.  Lymphadenopathy:     Head:     Right side of head: No submental or submandibular adenopathy.     Left side of head: No submental or submandibular adenopathy.     Cervical: No cervical adenopathy.     Upper Body:     Right upper body: No supraclavicular or axillary adenopathy.     Left upper body: No supraclavicular or axillary adenopathy.     Lower Body: No right inguinal adenopathy. No left inguinal adenopathy.  Skin:    General: Skin is warm.     Capillary Refill: Capillary refill takes less than 2 seconds.     Findings: No rash.  Neurological:      General:  No focal deficit present.     Mental Status: She is alert and oriented to person, place, and time.     Cranial Nerves: Cranial nerves 2-12 are intact.     Sensory: Sensation is intact.     Motor: Motor function is intact.     Coordination: Coordination is intact.     Gait: Gait is intact.     Deep Tendon Reflexes: Reflexes are normal and symmetric.  Psychiatric:        Speech: Speech normal.        Behavior: Behavior normal. Behavior is cooperative.      Assessment & Plan   CPE without pap Mammogram. Encouraged COVID vaccination Schedule for influenza vaccine clinic. Return FIT in 2 weeks.  2.  Need for Dental Care: Dental clinic referral  3.  Decreased visual acuity:  Referral for [facility] and lens voucher.  4.  Prediabetes/elevated trigs/obesity:  discussed diet and making goals for increased physical activity

## 2022-09-09 ENCOUNTER — Other Ambulatory Visit: Payer: Self-pay

## 2022-09-09 DIAGNOSIS — Z1211 Encounter for screening for malignant neoplasm of colon: Secondary | ICD-10-CM

## 2022-09-10 LAB — POC FIT TEST STOOL: Fecal Occult Blood: NEGATIVE

## 2022-10-21 ENCOUNTER — Other Ambulatory Visit: Payer: Self-pay | Admitting: Obstetrics and Gynecology

## 2022-10-21 DIAGNOSIS — Z1231 Encounter for screening mammogram for malignant neoplasm of breast: Secondary | ICD-10-CM

## 2022-11-09 ENCOUNTER — Ambulatory Visit: Payer: Self-pay | Admitting: Hematology and Oncology

## 2022-11-09 ENCOUNTER — Ambulatory Visit
Admission: RE | Admit: 2022-11-09 | Discharge: 2022-11-09 | Disposition: A | Discharge status: Home / Self Care | Payer: No Typology Code available for payment source | Source: Ambulatory Visit | Attending: Obstetrics and Gynecology | Admitting: Obstetrics and Gynecology

## 2022-11-09 VITALS — BP 118/76 | Wt 169.0 lb

## 2022-11-09 DIAGNOSIS — Z1231 Encounter for screening mammogram for malignant neoplasm of breast: Secondary | ICD-10-CM

## 2022-11-09 NOTE — Progress Notes (Signed)
[name] is a 52 y.o. female who presents to [facility] clinic today with no complaints.    Pap Smear: Pap not smear completed today due to hysterectomy.  Physical exam: Breasts Breasts symmetrical. No skin abnormalities bilateral breasts. No nipple retraction bilateral breasts. No nipple discharge bilateral breasts. No lymphadenopathy. No lumps palpated bilateral breasts.        Pelvic/Bimanual Pap is not indicated today    Smoking History: Patient has never smoked and was referred to quit line.    Patient Navigation: Patient education provided. Access to services provided for patient through [facility]CP program. Shelby Mccann interpreter provided. No transportation provided   Colorectal Cancer Screening: Per patient has never had colonoscopy completed No complaints today. Fit test completed 08/2022; negative   Breast and Cervical Cancer Risk Assessment: Patient does not have family history of breast cancer, known genetic mutations, or radiation treatment to the chest before age 64. Patient does not have history of cervical dysplasia, immunocompromised, or DES exposure in-utero.  Risk Scores as of Encounter on 11/09/2022     Shelby Mccann           5-year 0.72%   Lifetime 5.67%   This patient is [place]/Latina but has no documented birth country, so the [place] model used data from [place] patients to calculate their risk score. Document a birth country in the Demographics activity for a more accurate score.         Last calculated by Shelby Mccann, CMA on 11/09/2022 at  8:56 AM          A: B[facility]CP exam without pap smear No complaints with benign exam.   P: Referred patient to the [facility] for a screening mammogram. Appointment scheduled 11/09/2022.  Shelby Mccann A, NP 11/09/2022 8:51 AM

## 2022-11-09 NOTE — Patient Instructions (Signed)
Taught Shelby Mccann about self breast awareness and gave educational materials to take home. Patient did not need a Pap smear today due to hysterectomy. Referred patient to the [facility] for screening mammogram. Appointment scheduled for 11/09/2022. Patient aware of appointment and will be there. Let patient know will follow up with her within the next couple weeks with results. [name] verbalized understanding.  Pascal Lux, NP 8:54 AM

## 2022-11-15 NOTE — Progress Notes (Signed)
Case Management Client and son came in to get help with bills. We did a referral to [employer] for the son to get help finding a job. We gave some referrals to places that help with utility bills and possibly rent. 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 We also gave referrals for food pantries and they also shopped in ours. We will follow up with the client in 2 weeks to see how things are going.

## 2022-11-25 ENCOUNTER — Other Ambulatory Visit: Payer: Self-pay

## 2022-11-25 DIAGNOSIS — J029 Acute pharyngitis, unspecified: Secondary | ICD-10-CM

## 2022-11-25 LAB — POCT INFLUENZA A/B
Influenza A, POC: NEGATIVE
Influenza B, POC: NEGATIVE

## 2022-11-25 LAB — POC COVID19 BINAXNOW: SARS Coronavirus 2 Ag: NEGATIVE

## 2023-02-27 ENCOUNTER — Emergency Department (HOSPITAL_COMMUNITY): Payer: No Typology Code available for payment source

## 2023-02-27 ENCOUNTER — Emergency Department (HOSPITAL_COMMUNITY)
Admission: EM | Admit: 2023-02-27 | Discharge: 2023-02-27 | Disposition: A | Discharge status: Home / Self Care | Payer: No Typology Code available for payment source | Attending: Emergency Medicine | Admitting: Emergency Medicine

## 2023-02-27 ENCOUNTER — Encounter (HOSPITAL_COMMUNITY): Payer: Self-pay

## 2023-02-27 ENCOUNTER — Other Ambulatory Visit: Payer: Self-pay

## 2023-02-27 DIAGNOSIS — R109 Unspecified abdominal pain: Secondary | ICD-10-CM | POA: Insufficient documentation

## 2023-02-27 DIAGNOSIS — M545 Low back pain, unspecified: Secondary | ICD-10-CM | POA: Insufficient documentation

## 2023-02-27 LAB — CBC
HCT: 43.2 % (ref 36.0–46.0)
Hemoglobin: 14.2 g/dL (ref 12.0–15.0)
MCH: 28.1 pg (ref 26.0–34.0)
MCHC: 32.9 g/dL (ref 30.0–36.0)
MCV: 85.5 fL (ref 80.0–100.0)
Platelets: 355 10*3/uL (ref 150–400)
RBC: 5.05 MIL/uL (ref 3.87–5.11)
RDW: 13.7 % (ref 11.5–15.5)
WBC: 7.6 10*3/uL (ref 4.0–10.5)
nRBC: 0 % (ref 0.0–0.2)

## 2023-02-27 LAB — COMPREHENSIVE METABOLIC PANEL
ALT: 19 U/L (ref 0–44)
AST: 22 U/L (ref 15–41)
Albumin: 4.2 g/dL (ref 3.5–5.0)
Alkaline Phosphatase: 83 U/L (ref 38–126)
Anion gap: 12 (ref 5–15)
BUN: 12 mg/dL (ref 6–20)
CO2: 24 mmol/L (ref 22–32)
Calcium: 9.6 mg/dL (ref 8.9–10.3)
Chloride: 103 mmol/L (ref 98–111)
Creatinine, Ser: 0.52 mg/dL (ref 0.44–1.00)
GFR, Estimated: >60 mL/min (ref >60)
Glucose, Bld: 99 mg/dL (ref 70–99)
Potassium: 3.9 mmol/L (ref 3.5–5.1)
Sodium: 139 mmol/L (ref 135–145)
Total Bilirubin: 0.4 mg/dL (ref 0.0–1.2)
Total Protein: 7.8 g/dL (ref 6.5–8.1)

## 2023-02-27 LAB — URINALYSIS, ROUTINE W REFLEX MICROSCOPIC
Bilirubin Urine: NEGATIVE
Glucose, UA: NEGATIVE mg/dL
Hgb urine dipstick: NEGATIVE
Ketones, ur: NEGATIVE mg/dL
Leukocytes,Ua: NEGATIVE
Nitrite: NEGATIVE
Protein, ur: NEGATIVE mg/dL
Specific Gravity, Urine: 1.002 — ABNORMAL LOW (ref 1.005–1.030)
pH: 6 (ref 5.0–8.0)

## 2023-02-27 LAB — LIPASE, BLOOD: Lipase: 36 U/L (ref 11–51)

## 2023-02-27 MED ORDER — SODIUM CHLORIDE 0.9 % IV BOLUS
1000.0000 mL | Freq: Once | INTRAVENOUS | Status: AC
  Administered 2023-02-27: 1000 mL via INTRAVENOUS

## 2023-02-27 MED ORDER — KETOROLAC TROMETHAMINE 30 MG/ML IJ SOLN
15.0000 mg | Freq: Once | INTRAMUSCULAR | Status: AC
  Administered 2023-02-27: 15 mg via INTRAVENOUS
  Filled 2023-02-27: qty 1

## 2023-02-27 MED ORDER — ONDANSETRON HCL 4 MG/2ML IJ SOLN
4.0000 mg | Freq: Once | INTRAMUSCULAR | Status: AC
  Administered 2023-02-27: 4 mg via INTRAVENOUS
  Filled 2023-02-27: qty 2

## 2023-02-27 NOTE — ED Triage Notes (Addendum)
Pt presents with c/o lower back pain and groin pressure since Friday.  Pt states she feels like she has to urinate but she doesn't have to.  Denies dysuria and hematuria

## 2023-02-27 NOTE — Discharge Instructions (Signed)
Return for any problem.  Use ibuprofen as instructed for pain.  Take 600 mg every 8 hours as needed for pain.  Avoid heavy lifting.

## 2023-02-27 NOTE — ED Provider Notes (Signed)
[place] EMERGENCY DEPARTMENT AT [facility] Provider Note   CSN: [id number] Arrival date & time: 02/27/23  1734     History  Chief Complaint  Patient presents with   Flank Pain   Urinary Retention   Urinary Frequency    Shelby Mccann is a 53 y.o. female.  53 year old female with prior medical history as detailed below presents for evaluation.  Patient complains of bilateral flank pain.  This has been ongoing for the last 2 to 3 days.  Patient reports that she has a sensation of needing to urinate associated with her flank discomfort.    She denies any blood in her urine.  She denies prior history of renal colic.  She denies fever.  She did not take anything today for her symptoms.  The history is provided by the patient and medical records.       Home Medications Prior to Admission medications   Medication Sig Start Date End Date Taking? Authorizing Provider  cetirizine (ZYRTEC ALLERGY) 10 MG tablet Take 1 tablet (10 mg total) by mouth daily. 02/06/21   Wallis Bamberg, PA-C  Multiple Vitamin (MULTIVITAMIN WITH MINERALS) TABS tablet Take 1 tablet by mouth daily.    [provider]  triamcinolone cream (KENALOG) 0.1 % Apply to affected area twice daily as needed 07/07/22   Julieanne Manson, MD  UNABLE TO FIND daily. Med Name: Artibion for joint pain    [provider]      Allergies    Tessalon [benzonatate]    Review of Systems   Review of Systems  All other systems reviewed and are negative.   Physical Exam Updated Vital Signs BP 114/71 (BP Location: Left Arm)   Pulse 87   Temp 97.8 F (36.6 C) (Oral)   Resp 16   Ht 4\' 10"  (1.473 m)   Wt 77.1 kg   LMP 01/26/2011   SpO2 100%   BMI 35.53 kg/m  Physical Exam Vitals and nursing note reviewed.  Constitutional:      General: She is not in acute distress.    Appearance: Normal appearance. She is well-developed.  HENT:     Head: Normocephalic and atraumatic.  Eyes:      Conjunctiva/sclera: Conjunctivae normal.     Pupils: Pupils are equal, round, and reactive to light.  Cardiovascular:     Rate and Rhythm: Normal rate and regular rhythm.     Heart sounds: Normal heart sounds.  Pulmonary:     Effort: Pulmonary effort is normal. No respiratory distress.     Breath sounds: Normal breath sounds.  Abdominal:     General: There is no distension.     Palpations: Abdomen is soft.     Tenderness: There is no abdominal tenderness.  Genitourinary:    Comments: No CVA tenderness Musculoskeletal:        General: No deformity. Normal range of motion.     Cervical back: Normal range of motion and neck supple.  Skin:    General: Skin is warm and dry.  Neurological:     General: No focal deficit present.     Mental Status: She is alert and oriented to person, place, and time.     ED Results / Procedures / Treatments   Labs (all labs ordered are listed, but only abnormal results are displayed) Labs Reviewed  URINALYSIS, ROUTINE W REFLEX MICROSCOPIC  COMPREHENSIVE METABOLIC PANEL  CBC  LIPASE, BLOOD    EKG None  Radiology No results found.  Procedures Procedures    Medications Ordered in ED Medications  sodium chloride 0.9 % bolus 1,000 mL (has no administration in time range)  ondansetron (ZOFRAN) injection 4 mg (has no administration in time range)  ketorolac (TORADOL) 30 MG/ML injection 15 mg (has no administration in time range)    ED Course/ Medical Decision Making/ A&P                                 Medical Decision Making Amount and/or Complexity of Data Reviewed Labs: ordered. Radiology: ordered.  Risk Prescription drug management.    Medical Screen Complete  This patient presented to the ED with complaint of low back pain, flank pain.  This complaint involves an extensive number of treatment options. The initial differential diagnosis includes, but is not limited to, muscular pain, metabolic abnormality, renal colic,  etc.  This presentation is: Acute, Self-Limited, Previously Undiagnosed, Uncertain Prognosis, Complicated, and Systemic Symptoms  Patient complains of low back pain x 2 to 3 days.  This is associated with some urinary symptoms including urgency.  Patient without fever, nausea, vomiting, bowel movement change.  With IV fluids and Toradol patient feels significantly improved.  She reports that her pain is resolved.  Screening labs and CT imaging does not reveal significant acute pathology.  Patient understands need for close outpatient follow-up.  She desires discharge home at this time.  Strict return precautions given understood.   Additional history obtained:  External records from outside sources obtained and reviewed including prior ED visits and prior Inpatient records.    Lab Tests:  I ordered and personally interpreted labs.  The pertinent results include: CBC, CMP, lipase, UA   Imaging Studies ordered:  I ordered imaging studies including CT abdomen pelvis I independently visualized and interpreted obtained imaging which showed NAD I agree with the radiologist interpretation.  Medicines ordered:  I ordered medication including IV fluids, Toradol for back pain Reevaluation of the patient after these medicines showed that the patient: improved   Problem List / ED Course:  Back pain   Reevaluation:  After the interventions noted above, I reevaluated the patient and found that they have: resolved  Disposition:  After consideration of the diagnostic results and the patients response to treatment, I feel that the patent would benefit from close outpatient follow-up.          Final Clinical Impression(s) / ED Diagnoses Final diagnoses:  Acute low back pain without sciatica, unspecified back pain laterality    Rx / DC Orders ED Discharge Orders     None         Wynetta Fines, MD 02/27/23 2103

## 2023-02-27 NOTE — ED Provider Triage Note (Signed)
Emergency Medicine Provider Triage Evaluation Note  Shelby Mccann , a 53 y.o. female  was evaluated in triage.  Pt complains of bilateral flank pain radiating to lower abdomen and urinary urgency x 3 day. Denies Hx of kidney stones.   Denies fever, headache, vision changes, chest pain, shortness of breath, n/v/d, dysuria, hematuria, LE swelling, LE pain.   Review of Systems  Positive: N/a Negative: N/a  Physical Exam  BP 114/71 (BP Location: Left Arm)   Pulse 87   Temp 97.8 F (36.6 C) (Oral)   Resp 16   Ht 4\' 10"  (1.473 m)   Wt 77.1 kg   LMP 01/26/2011   SpO2 100%   BMI 35.53 kg/m  Gen:   Awake, no distress   Resp:  Normal effort  MSK:   Moves extremities without difficulty  Other:    Medical Decision Making  Medically screening exam initiated at 6:12 PM.  Appropriate orders placed.  Shelby Mccann was informed that the remainder of the evaluation will be completed by another provider, this initial triage assessment does not replace that evaluation, and the importance of remaining in the ED until their evaluation is complete.     Lunette Stands, [place] 02/27/23 1815

## 2023-03-02 ENCOUNTER — Telehealth: Payer: Self-pay

## 2023-03-02 NOTE — Telephone Encounter (Signed)
 Patient needs an after ED appointment. Patient was at ED for back pain, pain has not resolve. Patient also stated since Sunday she has not being able to go to the bathroom .  Patient is available any day at any time.

## 2023-03-02 NOTE — Telephone Encounter (Signed)
 Patient has been scheduled

## 2023-03-03 ENCOUNTER — Ambulatory Visit: Payer: Self-pay | Admitting: Internal Medicine

## 2023-03-03 ENCOUNTER — Encounter: Payer: Self-pay | Admitting: Internal Medicine

## 2023-03-03 VITALS — BP 110/74 | HR 88 | Resp 16 | Ht <= 58 in | Wt 172.0 lb

## 2023-03-03 DIAGNOSIS — G5603 Carpal tunnel syndrome, bilateral upper limbs: Secondary | ICD-10-CM

## 2023-03-03 DIAGNOSIS — E782 Mixed hyperlipidemia: Secondary | ICD-10-CM

## 2023-03-03 DIAGNOSIS — M546 Pain in thoracic spine: Secondary | ICD-10-CM

## 2023-03-03 DIAGNOSIS — Z23 Encounter for immunization: Secondary | ICD-10-CM

## 2023-03-03 DIAGNOSIS — K5900 Constipation, unspecified: Secondary | ICD-10-CM

## 2023-03-03 DIAGNOSIS — R7303 Prediabetes: Secondary | ICD-10-CM

## 2023-03-03 MED ORDER — POLYETHYLENE GLYCOL 3350 17 GM/SCOOP PO POWD: 17.0000 g | Freq: Two times a day (BID) | ORAL | Status: AC | PRN

## 2023-03-03 MED ORDER — FLEET SALINE ENEMA 7-19 GM/197ML RE ENEM: 19.0000 g | ENEMA | Freq: Once | RECTAL | Status: AC

## 2023-03-03 NOTE — Patient Instructions (Addendum)
 Carpal Tunnel Syndrome:  cock up splints for both wrists to wear every night. Keep bedroom 65-68 degrees and arms above covers Okay to go to use diclofenac  for numbness twice daily.

## 2023-03-03 NOTE — Progress Notes (Signed)
 Subjective:    Patient ID: Shelby Mccann, female   DOB: [birth year], 53 y.o.   MRN: [id number]   HPI  Shelby Mccann interprets  Pain in bilateral thoracic back pain without radiation starting about 1 week ago.   No history of acute injury to back or overuse.   Pain was enough that she went to ED 2 days later.  CT of abdomen and pelvis was normal save for asymptomatic gallstones without evidence of cholecystitis.  UA, CBC, CMP, Lipase were normal. Received Toradol  15 mg IM and Zofran  in the ED with improvement She did not feel Ibuprofen  was subsequently helping with the pain and is now taking Diclofenac  100 mg twice daily.  Also using topical menthol like gel twice daily with help.   Pain is generally better than when started.    2.  Constipation:  since being seen in ED 5 days ago.  She is having small, hard stools daily.  No blood.  Started Miralax  yesterday around 4 p.m --sounds like triple dose of 17g in about 4 ounces.  Repeated the same dose at 11 p.m.  This morning took olive oil tablespoon with lemon.  Has had 7 bottles of water.  Has not had any results. She has been eating less as she is concerned about her constipation Meat with rice and cheese in smaller portions. Salad with lettuce, tomatoes, cucumber and lemon.   She is also for past 2 months, taking some supplement that has fiber.  3.  Numbness in bilateral hands:  Wakes her from sleep.  Rarely has during day.  Problem for 5 years.  Brought up to previous provider.  Describes a ball in her wrist and points to her ventral wrist areas that previous doctor told her she had a problem with blood going back and forth. She was told she needed to stop working at [employer] and when she has the problem to open and close hands.     Current Meds  Medication Sig   cetirizine  (ZYRTEC  ALLERGY) 10 MG tablet Take 1 tablet (10 mg total) by mouth daily.   DICLOFENAC  SODIUM PO Take 100 mg by mouth as needed.   ibuprofen  (ADVIL )  200 MG tablet Take 200 mg by mouth every 6 (six) hours as needed.   Multiple Vitamin (MULTIVITAMIN WITH MINERALS) TABS tablet Take 1 tablet by mouth daily.   Allergies  Allergen Reactions   Tessalon  [Benzonatate ]     Pt reported burning, itching on vaginal area     Review of Systems    Objective:   BP 110/74 (BP Location: Left Arm, Patient Position: Sitting, Cuff Size: Normal)   Pulse 88   Resp 16   Ht 4' 10 (1.473 m)   Wt 172 lb (78 kg)   LMP 01/26/2011   BMI 35.95 kg/m   Physical Exam Constitutional:      Appearance: She is obese.  HENT:     Mouth/Throat:     Mouth: Mucous membranes are moist.     Pharynx: Oropharynx is clear.  Eyes:     Conjunctiva/sclera: Conjunctivae normal.     Pupils: Pupils are equal, round, and reactive to light.  Cardiovascular:     Rate and Rhythm: Normal rate and regular rhythm.     Pulses: Normal pulses.     Heart sounds: Normal heart sounds. No murmur heard.    No friction rub.  Pulmonary:     Effort: Pulmonary effort is normal.     Breath  sounds: Normal breath sounds.  Abdominal:     General: Bowel sounds are normal.     Palpations: Abdomen is soft. There is no mass.     Tenderness: There is no abdominal tenderness.     Hernia: No hernia is present.  Musculoskeletal:        General: Normal range of motion.     Cervical back: Normal range of motion and neck supple.     Comments: NT over spinous processes of cervical, thoracic spine.   Tender over medial thoracic musculature, right more than left.  Muscles tight.    Skin:    General: Skin is warm.     Findings: No rash.  Neurological:     Mental Status: She is alert.     Cranial Nerves: Cranial nerves 2-12 are intact.     Sensory: Sensation is intact.     Motor: Motor function is intact.     Coordination: Coordination is intact.     Gait: Gait is intact.     Deep Tendon Reflexes: Reflexes are normal and symmetric.     Comments: + Tinels and Phalens with median nerve at  volar wrists bilaterally      Assessment & Plan   Thoracic back pain:  appears more so on right and muscular.   Continue Diclofenac  with food as needed as well as topical muscle rub  Call if does not continue to gradually improve and consider PT  2.  Bilateral Carpal Tunnel Syndrome:  Cock up splints, Diclofenac , and keep bedroom cool at night.  3  Constipation:  recommend Fleets enema and if not a good result, Miralax  17 g twice daily until able to pass soft stools regularly.  Encouraged avoiding starches/other constipating foods and continue with increase fluids.    4.  Prediabetes:  A1C  5.  Hyperlipidemia:  FLP  6.  HM:  Shingrix  #1/2

## 2023-03-04 LAB — HGB A1C W/O EAG: Hgb A1c MFr Bld: 6 % — ABNORMAL HIGH (ref 4.8–5.6)

## 2023-03-04 LAB — LIPID PANEL W/O CHOL/HDL RATIO
Cholesterol, Total: 185 mg/dL (ref 100–199)
HDL: 46 mg/dL (ref >39)
LDL Chol Calc (NIH): 115 mg/dL — ABNORMAL HIGH (ref 0–99)
Triglycerides: 136 mg/dL (ref 0–149)
VLDL Cholesterol Cal: 24 mg/dL (ref 5–40)

## 2023-03-11 ENCOUNTER — Other Ambulatory Visit: Payer: Self-pay

## 2023-05-24 ENCOUNTER — Telehealth: Payer: Self-pay

## 2023-05-24 NOTE — Telephone Encounter (Signed)
 Patient needs appointment for allergy,  Patient was previously taking Cetirizine  but she noticed it stopped working and now she is currently taken advil  allergy.  But she would like to know if she can be seen by Doctor to see if can get a different medication because patient stated that she saw in the directions that med can not be taken everyday .  We will call patient if there is a cancellation,

## 2023-05-25 NOTE — Telephone Encounter (Signed)
 Dr. Jayne Mews suggested switching to Loratadine 10 mg daily or Allegra 180 mg daily or xyzal 5 mg daily or Clarinex 5 mg daily. People get less sensitive to an antihistamine over time and need to rotate every 1 to 2 years or so.

## 2023-05-30 NOTE — Telephone Encounter (Signed)
 LEFT VM TO RETURN CALL

## 2023-05-30 NOTE — Telephone Encounter (Signed)
 PATIENT IS AWARE PER BRENDA

## 2023-09-07 ENCOUNTER — Ambulatory Visit: Payer: Self-pay | Admitting: Internal Medicine

## 2023-09-07 ENCOUNTER — Encounter: Payer: Self-pay | Admitting: Internal Medicine

## 2023-09-07 VITALS — BP 122/76 | HR 92 | Resp 17 | Ht <= 58 in | Wt 172.0 lb

## 2023-09-07 DIAGNOSIS — E669 Obesity, unspecified: Secondary | ICD-10-CM | POA: Insufficient documentation

## 2023-09-07 DIAGNOSIS — Z0000 Encounter for general adult medical examination without abnormal findings: Secondary | ICD-10-CM

## 2023-09-07 DIAGNOSIS — Z1231 Encounter for screening mammogram for malignant neoplasm of breast: Secondary | ICD-10-CM

## 2023-09-07 DIAGNOSIS — Z23 Encounter for immunization: Secondary | ICD-10-CM

## 2023-09-07 DIAGNOSIS — E782 Mixed hyperlipidemia: Secondary | ICD-10-CM

## 2023-09-07 DIAGNOSIS — R7303 Prediabetes: Secondary | ICD-10-CM

## 2023-09-07 MED ORDER — VITAMIN D3 250 MCG (10000 UT) PO CAPS: 10000.0000 [IU] | ORAL_CAPSULE | Freq: Every day | ORAL | Status: AC

## 2023-09-07 NOTE — Progress Notes (Signed)
 Subjective:    Patient ID: Shelby Mccann, female   DOB: [birth year], 53 y.o.   MRN: [id number]   HPI  CPE without pap  1.  Pap:  normal prior to TAH for benign reasons.    2.  Mammogram:  Last 10/2022 and normal.  No family history of breast cancer.  She has not heard for repeat appt this year.    3.  Osteoprevention: 2 cups milk daily and cheese daily.  Walking 30 minutes daily.  She is taking D3  4.  Guaiac Cards/FIT:  Last 10-06-22 and negative for blood.    5.  Colonoscopy:  Never.  No family history of colon cancer.    6.  Immunizations:  Needs 2nd Shingrix , pneumococcal 20 Immunization History  Administered Date(s) Administered   Influenza,inj,Quad PF,6+ Mos 10/31/2014, 10/18/2016, 11/09/2017   Influenza-Unspecified 10/15/2022   Tdap 10/18/2016   Zoster Recombinant(Shingrix ) 03/03/2023     7.  Glucose/Cholesterol:  history of prediabetes with A1C in Feb at 6.0%.  Cholesterol also in Feb was improved with normal trig level as compared to previous year. Lipid Panel     Component Value Date/Time   CHOL 185 03/03/2023 0950   TRIG 136 03/03/2023 0950   HDL 46 03/03/2023 0950   CHOLHDL 3.5 06/06/2020 1129   CHOLHDL 3.6 01/07/2014 0910   VLDL 27 01/07/2014 0910   LDLCALC 115 (H) 03/03/2023 0950   LABVLDL 24 03/03/2023 0950     Current Meds  Medication Sig   cetirizine  (ZYRTEC  ALLERGY) 10 MG tablet Take 1 tablet (10 mg total) by mouth daily.   polyethylene glycol powder (GLYCOLAX /MIRALAX ) 17 GM/SCOOP powder Take 17 g by mouth 2 (two) times daily as needed.   Allergies  Allergen Reactions   Tessalon  [Benzonatate ]     Pt reported burning, itching on vaginal area   Past Medical History:  Diagnosis Date   Allergy    Hyperlipidemia    Obesity (BMI 35.0-39.9 without comorbidity)    Prediabetes 05/2022   Past Surgical History:  Procedure Laterality Date   ABDOMINAL HYSTERECTOMY  01/26/2011   uterine prolapse   Family History  Problem Relation Age of  Onset   Cancer Mother        uterine cancer   Suicidality Father        completed suicide.  Pt relates to his son being killed.   Breast cancer Neg Hx    Social History   Socioeconomic History   Marital status: Widowed    Spouse name: Not on file   Number of children: 3   Years of education: Not on file   Highest education level: Not on file  Occupational History   Occupation: housecleaning--owns business  Tobacco Use   Smoking status: Never    Passive exposure: Never   Smokeless tobacco: Never  Vaping Use   Vaping status: Never Used  Substance and Sexual Activity   Alcohol use: No   Drug use: No   Sexual activity: Not Currently    Birth control/protection: Surgical    Comment: TAH  Other Topics Concern   Not on file  Social History Narrative   Husband died in 2022-10-06   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   Lives with her eldest and youngest sons.  Eldest to move out with his girlfriend shortly.   Middle son in [place]   Social Drivers of Health   Financial Resource Strain: Low Risk  (09/07/2023)   Overall Financial Resource  Strain (CARDIA)    Difficulty of Paying Living Expenses: Not very hard  Food Insecurity: No Food Insecurity (09/07/2023)   Hunger Vital Sign    Worried About Running Out of Food in the Last Year: Never true    Ran Out of Food in the Last Year: Never true  Transportation Needs: No Transportation Needs (09/07/2023)   PRAPARE - Administrator, Civil Service (Medical): No    Lack of Transportation (Non-Medical): No  Physical Activity: Not on file  Stress: Not on file  Social Connections: Unknown (06/09/2021)   Received from [facility]   Social Network    Social Network: Not on file  Intimate Partner Violence: Not At Risk (09/07/2023)   Humiliation, Afraid, Rape, and Kick questionnaire    Fear of Current or Ex-Partner: No    Emotionally Abused: No    Physically Abused: No    Sexually Abused: No    *  Review  of Systems  HENT:  Negative for dental problem (Going to dental clinic.).   Eyes:  Negative for visual disturbance (Had to cancel referral appt as was ill.  Planning to call and schedule again this week.).  Respiratory:  Negative for shortness of breath.   Cardiovascular:  Positive for chest pain (continues with costochondritis--controls with exercises given by a chiropractor.  PT helped as well initially.  She stopped going to PT as last visit exacerbated her pain.). Negative for palpitations and leg swelling.  Gastrointestinal:  Negative for constipation (Resolved from Feb).  Musculoskeletal:        Thoracic back pain resolved from visit in Feb      Objective:   BP 122/76 (BP Location: Left Arm, Patient Position: Sitting, Cuff Size: Normal)   Pulse 92   Resp 17   Ht 4' 10 (1.473 m)   Wt 172 lb (78 kg)   LMP 01/26/2011   BMI 35.95 kg/m   Physical Exam Constitutional:      Appearance: She is obese.  HENT:     Head: Normocephalic and atraumatic.     Right Ear: Tympanic membrane, ear canal and external ear normal.     Left Ear: Tympanic membrane, ear canal and external ear normal.     Nose: Nose normal.     Mouth/Throat:     Mouth: Mucous membranes are moist.     Pharynx: Oropharynx is clear.     Comments: Upper partial. Eyes:     Extraocular Movements: Extraocular movements intact.     Conjunctiva/sclera: Conjunctivae normal.     Pupils: Pupils are equal, round, and reactive to light.     Comments: Discs sharp  Neck:     Thyroid: No thyroid mass or thyromegaly.  Cardiovascular:     Rate and Rhythm: Normal rate and regular rhythm.     Heart sounds: S1 normal and S2 normal. No murmur heard.    No friction rub. No S3 or S4 sounds.     Comments: No carotid bruits.  Carotid, radial, femoral, DP and PT pulses normal and equal.  Spider veins and mild varicosities of bilateral LE Pulmonary:     Effort: Pulmonary effort is normal.     Breath sounds: Normal breath sounds and  air entry.  Chest:  Breasts:    Right: No inverted nipple, mass or nipple discharge.     Left: No inverted nipple, mass or nipple discharge.  Abdominal:     General: Bowel sounds are normal.     Palpations: Abdomen  is soft. There is no hepatomegaly, splenomegaly or mass.     Tenderness: There is no abdominal tenderness.     Hernia: No hernia is present.  Genitourinary:    General: Normal vulva.     Comments: No adnexal mass or tenderness. Musculoskeletal:        General: Normal range of motion.     Cervical back: Normal range of motion and neck supple.     Right lower leg: No edema.     Left lower leg: No edema.  Lymphadenopathy:     Head:     Right side of head: No submental or submandibular adenopathy.     Left side of head: No submental or submandibular adenopathy.     Cervical: No cervical adenopathy.     Upper Body:     Right upper body: No supraclavicular or axillary adenopathy.     Left upper body: No supraclavicular or axillary adenopathy.     Lower Body: No right inguinal adenopathy. No left inguinal adenopathy.  Skin:    General: Skin is warm.     Capillary Refill: Capillary refill takes less than 2 seconds.     Findings: No rash.  Neurological:     General: No focal deficit present.     Mental Status: She is alert and oriented to person, place, and time.     Cranial Nerves: Cranial nerves 2-12 are intact.     Sensory: Sensation is intact.     Motor: Motor function is intact.     Coordination: Coordination is intact.     Gait: Gait is intact.     Deep Tendon Reflexes: Reflexes are normal and symmetric.  Psychiatric:        Mood and Affect: Mood normal.        Speech: Speech normal.        Behavior: Behavior normal. Behavior is cooperative.      Assessment & Plan   CPE without pap Schedule mammogram with [facility] FIT to return in 2 weeks.   Shingrix  #2/2 Declines Pneumococcal 20 today--will obtain with fasting labs  2.  Prediabetes:  A1C with labs  3.   Hyperlipidemia:  FLP with labs  4.  Obesity:  encouraged continuing to work on diet and physical activity goals   5.  Costochondritis:  chronic recurrent issue:  encouraged her to do exercises daily to prevent recurrence.

## 2023-09-07 NOTE — Patient Instructions (Signed)
 Tome un vaso de agua antes de cada comida Tome un minimo de 6 a 8 vasos de agua diarios Coma tres veces al dia Coma una proteina y [name] grasa saludable con comida.  (huevos, pescado, pollo, pavo, y limite carnes rojas Coma 5 porciones diarias de legumbres.  Mezcle los colores Coma 2 porciones diarias de frutas con cascara cuando sea comestible Use platos pequeos Suelte su tenedor o cuchara despues de cada mordida hata que se mastique y se trague Come en la mesa con amigos o familiares por lo menos una vez al dia Apague la televisin y aparatos electrnicos durante la comida  Su objetivo debe ser perder una libra por semana  Estudios recientes indican que las personas quienes consumen todos de sus calorias durante 12 horas se bajan de pesocon Mas eficiencia.  Por ejemplo, si Usted come su primera comida a las 7:00 a.m., su comida final del dia se debe completar antes de las 7:00 p.m.

## 2023-09-14 ENCOUNTER — Other Ambulatory Visit: Payer: Self-pay

## 2023-09-14 DIAGNOSIS — Z114 Encounter for screening for human immunodeficiency virus [HIV]: Secondary | ICD-10-CM

## 2023-09-14 DIAGNOSIS — R7303 Prediabetes: Secondary | ICD-10-CM

## 2023-09-14 DIAGNOSIS — Z1159 Encounter for screening for other viral diseases: Secondary | ICD-10-CM

## 2023-09-14 DIAGNOSIS — E782 Mixed hyperlipidemia: Secondary | ICD-10-CM

## 2023-09-14 DIAGNOSIS — Z1211 Encounter for screening for malignant neoplasm of colon: Secondary | ICD-10-CM

## 2023-09-14 DIAGNOSIS — Z23 Encounter for immunization: Secondary | ICD-10-CM

## 2023-09-14 LAB — POC FIT TEST STOOL: Fecal Occult Blood: NEGATIVE

## 2023-09-14 NOTE — Addendum Note (Signed)
 Addended by: DASIE VIKI SAILOR on: 09/14/2023 11:49 AM   Modules accepted: Orders

## 2023-09-15 ENCOUNTER — Other Ambulatory Visit: Payer: Self-pay

## 2023-09-15 LAB — CBC WITH DIFFERENTIAL/PLATELET
Basophils Absolute: 0.1 x10E3/uL (ref 0.0–0.2)
Basos: 1 %
EOS (ABSOLUTE): 0.1 x10E3/uL (ref 0.0–0.4)
Eos: 2 %
Hematocrit: 44.9 % (ref 34.0–46.6)
Hemoglobin: 14.6 g/dL (ref 11.1–15.9)
Immature Grans (Abs): 0 x10E3/uL (ref 0.0–0.1)
Immature Granulocytes: 0 %
Lymphocytes Absolute: 2.4 x10E3/uL (ref 0.7–3.1)
Lymphs: 41 %
MCH: 29 pg (ref 26.6–33.0)
MCHC: 32.5 g/dL (ref 31.5–35.7)
MCV: 89 fL (ref 79–97)
Monocytes Absolute: 0.3 x10E3/uL (ref 0.1–0.9)
Monocytes: 6 %
Neutrophils Absolute: 3.1 x10E3/uL (ref 1.4–7.0)
Neutrophils: 50 %
Platelets: 357 x10E3/uL (ref 150–450)
RBC: 5.04 x10E6/uL (ref 3.77–5.28)
RDW: 14.1 % (ref 11.7–15.4)
WBC: 6 x10E3/uL (ref 3.4–10.8)

## 2023-09-15 LAB — COMPREHENSIVE METABOLIC PANEL WITH GFR
ALT: 33 IU/L — ABNORMAL HIGH (ref 0–32)
AST: 26 IU/L (ref 0–40)
Albumin: 4.4 g/dL (ref 3.8–4.9)
Alkaline Phosphatase: 115 IU/L (ref 44–121)
BUN/Creatinine Ratio: 18 (ref 9–23)
BUN: 12 mg/dL (ref 6–24)
Bilirubin Total: 0.2 mg/dL (ref 0.0–1.2)
CO2: 24 mmol/L (ref 20–29)
Calcium: 10 mg/dL (ref 8.7–10.2)
Chloride: 102 mmol/L (ref 96–106)
Creatinine, Ser: 0.68 mg/dL (ref 0.57–1.00)
Globulin, Total: 2.8 g/dL (ref 1.5–4.5)
Glucose: 90 mg/dL (ref 70–99)
Potassium: 4.3 mmol/L (ref 3.5–5.2)
Sodium: 140 mmol/L (ref 134–144)
Total Protein: 7.2 g/dL (ref 6.0–8.5)
eGFR: 103 mL/min/1.73 (ref >59)

## 2023-09-15 LAB — LIPID PANEL
Chol/HDL Ratio: 4.3 ratio (ref 0.0–4.4)
Cholesterol, Total: 229 mg/dL — ABNORMAL HIGH (ref 100–199)
HDL: 53 mg/dL (ref >39)
LDL Chol Calc (NIH): 148 mg/dL — ABNORMAL HIGH (ref 0–99)
Triglycerides: 155 mg/dL — ABNORMAL HIGH (ref 0–149)
VLDL Cholesterol Cal: 28 mg/dL (ref 5–40)

## 2023-09-15 LAB — HEMOGLOBIN A1C
Est. average glucose Bld gHb Est-mCnc: 117 mg/dL
Hgb A1c MFr Bld: 5.7 % — ABNORMAL HIGH (ref 4.8–5.6)

## 2023-09-15 LAB — HEPATITIS C ANTIBODY: Hep C Virus Ab: NONREACTIVE

## 2023-09-15 LAB — HIV ANTIBODY (ROUTINE TESTING W REFLEX): HIV Screen 4th Generation wRfx: NONREACTIVE

## 2023-09-16 ENCOUNTER — Other Ambulatory Visit: Payer: Self-pay

## 2023-09-19 ENCOUNTER — Telehealth: Payer: Self-pay

## 2023-09-19 NOTE — Telephone Encounter (Addendum)
 Telephoned patient at mobile number using interpreter#[phone]. Left a voice message with [facility] (scholarship) contact information.  Telephoned patient using interpreter, Francee Sprung. Patient stated she is at an appointment. She will call [facility] back and schedule appointment. 10/04/2023

## 2023-10-18 ENCOUNTER — Ambulatory Visit: Payer: Self-pay | Admitting: Internal Medicine

## 2023-10-18 ENCOUNTER — Telehealth: Payer: Self-pay | Admitting: Internal Medicine

## 2023-10-18 VITALS — BP 122/76 | HR 88 | Resp 18 | Ht 59.0 in | Wt 174.0 lb

## 2023-10-18 DIAGNOSIS — M545 Low back pain, unspecified: Secondary | ICD-10-CM

## 2023-10-18 MED ORDER — DICLOFENAC SODIUM 75 MG PO TBEC: DELAYED_RELEASE_TABLET | ORAL | 0 refills | Status: AC

## 2023-10-18 NOTE — Progress Notes (Cosign Needed)
 [id number] Ariel interpretor I, MD in training, did initial H/P, then presented to Dr. Adella who then evaluated also, did orders, counseling, education, discharge instructions, verbal and written, while I was in the room.   This is a summary of both evaluations.   CC- mid low back pain HPI. 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 No recall of unsual activities . Woke up 4 days ago with right flank pain that is constant, getting worse, now has pressure on the left flank area also.   No previous h/o this.  No fall, blunt trauma.  Has had a hysterectomy.    No urinary frequency, dysurea, hematuria.  Had some cough and runny nose 2 weeks ago. Had stopped coughing when pain started; it started about 1 week after the cough stopped. No numbness or tingling in the feet and legs or buttock.  Never had this before.  NO bowel or bladder incontinance.  Past Medical History:  Diagnosis Date   Allergy    Hyperlipidemia    Obesity (BMI 35.0-39.9 without comorbidity)    Prediabetes 05/2022    Past Surgical History:  Procedure Laterality Date   ABDOMINAL HYSTERECTOMY  01/26/2011   uterine prolapse    So Hx- housekeeper  Allergies  Allergen Reactions   Tessalon  [Benzonatate ]     Pt reported burning, itching on vaginal area    Current Meds  Medication Sig   Cholecalciferol (VITAMIN D3) 250 MCG (10000 UT) capsule Take 1 capsule (10,000 Units total) by mouth daily.   polyethylene glycol powder (GLYCOLAX /MIRALAX ) 17 GM/SCOOP powder Take 17 g by mouth 2 (two) times daily as needed.    ROS- Consitutional- no fever; stopped exercising.  Review of cholesterol  showed elevation from previous.  HEENT- 2 week ago had cough and cold Endocrine- improved hgbA1c GYN- h/o hysterectomy GI- very mild elev of LFT's upon review of recent labs probably from obesity /steatosis GU- no urinary sx's   PEX Vitals:   10/18/23 1402  BP: 122/76  Pulse: 88  Resp: 18   Physical  Exam Vitals and nursing note reviewed.  Constitutional:      General: She is not in acute distress.    Appearance: Normal appearance. She is obese. She is not ill-appearing, toxic-appearing or diaphoretic.  HENT:     Head: Normocephalic and atraumatic.     Ears:     Comments: Nl hearing    Nose:     Comments: Occasional sniffing, no obvious congestion by listening Cardiovascular:     Comments: DP B in feet Pulmonary:     Effort: Pulmonary effort is normal.  Abdominal:     Palpations: Abdomen is soft.     Tenderness: There is no abdominal tenderness. There is no right CVA tenderness, left CVA tenderness or guarding.  Genitourinary:    Comments: Rectal not done as it would not change management and invasive Musculoskeletal:        General: Tenderness (tender L1-L3, right para lumbar area?left paralumbar area; pain increaed when pt asked to touch toes, bend laterally) present. No deformity or signs of injury.     Cervical back: Neck supple. No rigidity or tenderness.     Comments: Nl gait. Able to walk on heels and tip-toes  Skin:    Capillary Refill: Capillary refill takes less than 2 seconds.     Findings: No rash.     Comments: No cellulitis  Neurological:     General: No focal deficit present.  Mental Status: She is alert and oriented to person, place, and time.     Sensory: Sensory deficit: intact L2-s2 senation and motor.     Motor: No weakness.     Coordination: Coordination normal.     Gait: Gait normal.     Deep Tendon Reflexes: Reflexes normal (1-2 + B patellar and ankles).  Psychiatric:        Mood and Affect: Mood normal.        Behavior: Behavior normal.        Thought Content: Thought content normal.        Judgment: Judgment normal.     A/P 1- Acute B low back pain without sciatica.- diclofenac  bid for 1 month,  PT, heating pads. Dr. Adella advised patient on wt loss 2- Incidentally, recent labs reviewed with patient by Dr. Adella.  Cholesterol  significantly high compared to previous.  Pt asked to start exercising again and lose wt. 6 month follow-up.  3- Flu shot in 4 days.  Pt expressed understanding.   RTC prn and for physical

## 2023-10-18 NOTE — Telephone Encounter (Signed)
 Patient needs an appointment for patient suspects she has kidney issues.  Patient states she has pain on her lower back towards her right side and this morning she has notice burning and pressure sensation .  Patient states today she has also notice she is feeling pressure and discomfort on her left side of lower back.   Patient states she does not have urine problems .   Patient has taken diclofenac  for two days , symptoms calm down but came back patient states she does not want to continue taking pills she would like to be seen by the doctor instead.    We will call patient if there is a cancellation.

## 2023-10-18 NOTE — Progress Notes (Deleted)
   Subjective:    Patient ID: Shelby Mccann, female    DOB: [birth year], 53 y.o.   MRN: [id number]  HPI    Review of Systems     Objective:   Physical Exam        Assessment & Plan:

## 2023-10-18 NOTE — Patient Instructions (Signed)
[facility]:  [phone]  

## 2023-10-18 NOTE — Progress Notes (Signed)
 Shelby Mccann [phone]  Went over labs--cholesterol up last month and she states she is not as physically active.   A1C down to 5.7% from 6.0% in February. HIV and Hep C screenings negative. FIT negative.  Patient seen with Dr. Fleta.  Agree with evaluation and plan.

## 2023-10-21 NOTE — Telephone Encounter (Signed)
 Patient has been seen.

## 2023-11-14 ENCOUNTER — Encounter: Payer: Self-pay | Admitting: Internal Medicine

## 2023-12-28 ENCOUNTER — Encounter: Payer: Self-pay | Admitting: Internal Medicine

## 2023-12-28 ENCOUNTER — Ambulatory Visit: Payer: Self-pay | Admitting: Internal Medicine

## 2023-12-28 VITALS — BP 120/80 | HR 77 | Resp 18 | Ht 59.0 in | Wt 173.0 lb

## 2023-12-28 DIAGNOSIS — M79604 Pain in right leg: Secondary | ICD-10-CM

## 2023-12-28 DIAGNOSIS — Z23 Encounter for immunization: Secondary | ICD-10-CM

## 2023-12-28 NOTE — Progress Notes (Unsigned)
 Established Patient Office Visit  Subjective  Erminio Bloomer interprets  I, MD in training, did H/P with Dr. Adella present in the room.   She also did her own eval, final A/P, orders, pt discharge instructions.   Patient ID: Claretta JONETTA Neda Sheela, female    DOB: 1970-10-22  Age: 53 y.o. MRN: [id number]  Chief Complaint  Patient presents with   Knee Pain    Three week knee pain     HPI- 3 weeks of pain in the back of right knee and front of right ankle.  No prior history of this. I woke up one morning and it was like this.  Pain quality: cannot describe. Intensity 5/10 in the beginning and 8/10 now. It is getting worse.  The right ankle sometimes pop with rotation. Worse with walking and palpation and better with non-weightbearing and ice. Not affected with ROM of ankle and knee at rest. Taking Percocet and Diclofenace that were given by MD at Urgent Care on Thanksgiving.  These help but when they wear off, she has pain. Also had xrays of the right ankle and knee and tib-fib at [facility] that were neg per pt and Care Everywhere on [facility]. Also was given exercises that do not harm or help per pt.  Also, prior to seeing  the UC  doc, saw 2 massage therapists who told her it was her tendon and massaged the back of the knee, causing a bruise and maybe worsening the pain; it did not help.  Cleans houses for living. Does not recall any injuries or moving in an unusual way when making beds at work. Works 2 days a week.      Pt does not have any thoughts about what it might be. No long car trips,  No increase in sedentariness. No SOB or CP .   Patient Active Problem List  Obesity (BMI 35.0-39.9 without comorbidity)  Grief  Melasma  Financial difficulties  Prediabetes  Chronic Costochondritis  Age spots  Mixed hyperlipidemia  Environmental allergies    FHx- No joint disease.  No clotting disorders.  All -- Tessalon  [Benzonatate ]  --  Pt reported burning, itching on vaginal area  Outpatient  Medications Marked as Taking for the 12/28/23 encounter (Office Visit) with Adella Norris, MD: diclofenac  (VOLTAREN ) 75 MG EC tablet, 1 tab by mouth twice daily with meals as needed for pain HYDROcodone-acetaminophen  (NORCO/VICODIN) 5-325 MG tablet, Take 1 tablet by mouth.     Review of Systems  Constitutional:        Flu vaccine due  Respiratory:  Negative for shortness of breath.   Musculoskeletal:        No other joint pain except those mentioned in HPI. No pain in feet.  Neurological:  Negative for loss of consciousness.  Psychiatric/Behavioral:         No increased stress    Objective:     BP 120/80 (BP Location: Right Arm, Patient Position: Sitting, Cuff Size: Normal)   Pulse 77   Resp 18   Ht 4' 11 (1.499 m)   Wt 173 lb (78.5 kg)   LMP 01/26/2011   BMI 34.94 kg/m   Physical Exam Vitals and nursing note reviewed.  Constitutional:      General: She is not in acute distress.    Appearance: Normal appearance. She is not ill-appearing, toxic-appearing or diaphoretic.     Comments: High BMI  Cardiovascular:     Comments: DP 2+ B Cannot feel any obvious pulsating masses  in the back of the right knee Pulmonary:     Effort: Pulmonary effort is normal.  Musculoskeletal:        General: No deformity.       Legs:     Comments: Right Knee area: No swelling or increased warmth, No size difference between the 2 knees. +Tenderness of the area above.  Maximal tenderness in the purple area along with a bruise 4 by 4 cm in diameter.   Full ROM at the knee without pain.  No pain with varus/valgus stress. Neg ant. and post. drawer signs.   Anterior Right ankle-Mixed exam: initially very tender with palpation in the area above, then repeat exam revealed NO tenderness with palpation of the anterior and posterior ankle.  No swelling or increased warmth, or pain with passive and active ROM.  There was slight tenderness at the distal achilles tendon initially but then not, upon  further palpation.    Slight pain elicited in the sole of the feet along plantar fascia distally with internal rotation of the ankle. No tenderness of plantar fascia.  Feet normal in appearance. Both calves equal in diameter by inspection.    Right hip with intact ROM    Skin:    Capillary Refill: Capillary refill takes less than 2 seconds. feet    Findings: Bruising (see musculoskletal exam) present. No erythema.  Neurological:     Mental Status: She is alert.     Sensory: Sensory deficit present.     Gait: Gait abnormal (favors the right leg slightly when she walks.).     Comments: Intact sensation in the feet B.  Psychiatric:        Behavior: Behavior normal.        Judgment: Judgment normal.     Comments: Mood appropriate for the situation     Assessment & Plan:  Right leg pain - Plan: VAS US  LOWER EXTREMITY VENOUS (DVT), CANCELED: VAS US  LOWER EXTREMITY VENOUS (DVT), CANCELED: US  Venous Img Lower Unilateral Right (DVT)  Need for influenza vaccination - Plan: Influenza, MDCK, trivalent, PF(Flucelvax egg-free)   Caron Kaiser, MD

## 2023-12-28 NOTE — Progress Notes (Unsigned)
 Most tender in soft tissue over what seems to be a cord like structure in same area of bruising at distal posterior medial femoral area.  Does not appear to be associated with joint itself No current tenderness, swelling of knee joint or of right ankle joint and no laxity or pain with stress of ligaments or tendons of joint Most pressing concern is DVT, though this could be a superficial phlebitis.   Sending for venous studies of LE.   Discussed to go to ED if dyspnea, chest pain, palpitations. SABRA

## 2023-12-29 ENCOUNTER — Ambulatory Visit (HOSPITAL_COMMUNITY)
Admission: RE | Admit: 2023-12-29 | Discharge: 2023-12-29 | Disposition: A | Discharge status: Home / Self Care | Payer: Self-pay | Source: Ambulatory Visit | Attending: Internal Medicine | Admitting: Internal Medicine

## 2023-12-29 DIAGNOSIS — M79604 Pain in right leg: Secondary | ICD-10-CM | POA: Insufficient documentation

## 2023-12-29 NOTE — Progress Notes (Unsigned)
 I called the DVT clinic, talked about the patient.  They are going to call her and schedule appointment with her.

## 2024-01-08 ENCOUNTER — Ambulatory Visit: Payer: Self-pay | Admitting: Internal Medicine

## 2024-01-08 DIAGNOSIS — M79604 Pain in right leg: Secondary | ICD-10-CM

## 2024-01-09 NOTE — Progress Notes (Signed)
 Order printed and faxed to PT

## 2024-04-17 ENCOUNTER — Other Ambulatory Visit: Payer: Self-pay

## 2024-09-07 ENCOUNTER — Other Ambulatory Visit: Payer: Self-pay

## 2024-09-12 ENCOUNTER — Encounter: Payer: Self-pay | Admitting: Internal Medicine
